# Patient Record
Sex: Female | Born: 1975
Health system: Southern US, Community
[De-identification: ages and names within clinical notes are randomized; demographics above are authoritative.]

## PROBLEM LIST (undated history)

## (undated) DIAGNOSIS — J45909 Unspecified asthma, uncomplicated: Secondary | ICD-10-CM

## (undated) DIAGNOSIS — N946 Dysmenorrhea, unspecified: Secondary | ICD-10-CM

## (undated) DIAGNOSIS — K649 Unspecified hemorrhoids: Secondary | ICD-10-CM

## (undated) DIAGNOSIS — N83202 Unspecified ovarian cyst, left side: Secondary | ICD-10-CM

## (undated) DIAGNOSIS — I499 Cardiac arrhythmia, unspecified: Secondary | ICD-10-CM

## (undated) DIAGNOSIS — I10 Essential (primary) hypertension: Secondary | ICD-10-CM

## (undated) DIAGNOSIS — Z309 Encounter for contraceptive management, unspecified: Secondary | ICD-10-CM

## (undated) DIAGNOSIS — K602 Anal fissure, unspecified: Secondary | ICD-10-CM

## (undated) DIAGNOSIS — N83201 Unspecified ovarian cyst, right side: Secondary | ICD-10-CM

## (undated) HISTORY — DX: Dysmenorrhea, unspecified: N94.6

## (undated) HISTORY — DX: Unspecified ovarian cyst, right side: N83.201

## (undated) HISTORY — DX: Unspecified ovarian cyst, left side: N83.202

## (undated) HISTORY — DX: Anal fissure, unspecified: K60.2

## (undated) HISTORY — PX: WISDOM TOOTH EXTRACTION: SHX21

## (undated) HISTORY — DX: Encounter for contraceptive management, unspecified: Z30.9

## (undated) HISTORY — DX: Unspecified hemorrhoids: K64.9

## (undated) HISTORY — DX: Unspecified asthma, uncomplicated: J45.909

---

## 2001-04-06 ENCOUNTER — Other Ambulatory Visit: Admission: RE | Admit: 2001-04-06 | Discharge: 2001-04-06 | Payer: Self-pay | Admitting: *Deleted

## 2006-06-14 ENCOUNTER — Emergency Department (HOSPITAL_COMMUNITY): Admission: EM | Admit: 2006-06-14 | Discharge: 2006-06-14 | Payer: Self-pay | Admitting: Emergency Medicine

## 2008-01-19 ENCOUNTER — Other Ambulatory Visit: Admission: RE | Admit: 2008-01-19 | Discharge: 2008-01-19 | Payer: Self-pay | Admitting: Obstetrics and Gynecology

## 2008-12-28 ENCOUNTER — Other Ambulatory Visit: Admission: RE | Admit: 2008-12-28 | Discharge: 2008-12-28 | Payer: Self-pay | Admitting: Obstetrics & Gynecology

## 2009-02-06 ENCOUNTER — Ambulatory Visit (HOSPITAL_COMMUNITY): Admission: RE | Admit: 2009-02-06 | Discharge: 2009-02-06 | Payer: Self-pay | Admitting: Obstetrics & Gynecology

## 2009-02-20 ENCOUNTER — Ambulatory Visit (HOSPITAL_COMMUNITY): Admission: RE | Admit: 2009-02-20 | Discharge: 2009-02-20 | Payer: Self-pay | Admitting: Obstetrics & Gynecology

## 2009-04-03 ENCOUNTER — Ambulatory Visit (HOSPITAL_COMMUNITY): Admission: RE | Admit: 2009-04-03 | Discharge: 2009-04-03 | Payer: Self-pay | Admitting: Obstetrics & Gynecology

## 2009-05-02 ENCOUNTER — Ambulatory Visit (HOSPITAL_COMMUNITY): Admission: RE | Admit: 2009-05-02 | Discharge: 2009-05-02 | Payer: Self-pay | Admitting: Obstetrics & Gynecology

## 2009-05-31 ENCOUNTER — Ambulatory Visit (HOSPITAL_COMMUNITY): Admission: RE | Admit: 2009-05-31 | Discharge: 2009-05-31 | Payer: Self-pay | Admitting: Obstetrics & Gynecology

## 2009-06-18 ENCOUNTER — Ambulatory Visit: Payer: Self-pay | Admitting: Obstetrics and Gynecology

## 2009-06-18 ENCOUNTER — Inpatient Hospital Stay (HOSPITAL_COMMUNITY): Admission: AD | Admit: 2009-06-18 | Discharge: 2009-06-18 | Payer: Self-pay | Admitting: Obstetrics & Gynecology

## 2009-06-23 ENCOUNTER — Ambulatory Visit: Payer: Self-pay | Admitting: Physician Assistant

## 2009-06-23 ENCOUNTER — Inpatient Hospital Stay (HOSPITAL_COMMUNITY): Admission: AD | Admit: 2009-06-23 | Discharge: 2009-06-23 | Payer: Self-pay | Admitting: Obstetrics & Gynecology

## 2009-06-26 ENCOUNTER — Inpatient Hospital Stay (HOSPITAL_COMMUNITY): Admission: AD | Admit: 2009-06-26 | Discharge: 2009-06-29 | Payer: Self-pay | Admitting: Obstetrics & Gynecology

## 2009-06-26 ENCOUNTER — Ambulatory Visit: Payer: Self-pay | Admitting: Obstetrics and Gynecology

## 2009-06-27 ENCOUNTER — Encounter: Payer: Self-pay | Admitting: Obstetrics & Gynecology

## 2010-09-05 IMAGING — US US OB FOLLOW-UP
1 series · 14 of 28 positions shown · non-contrast
Comparison: none

OBSTETRICAL ULTRASOUND:
 This ultrasound exam was performed in the [HOSPITAL] Ultrasound Department.  The OB US report was generated in the AS system, and faxed to the ordering physician.  This report is also available in [REDACTED] PACS.

[Series 1: us ob follow up · 0.33mm/px · 14 of 37 slices shown]
[im 2/37]
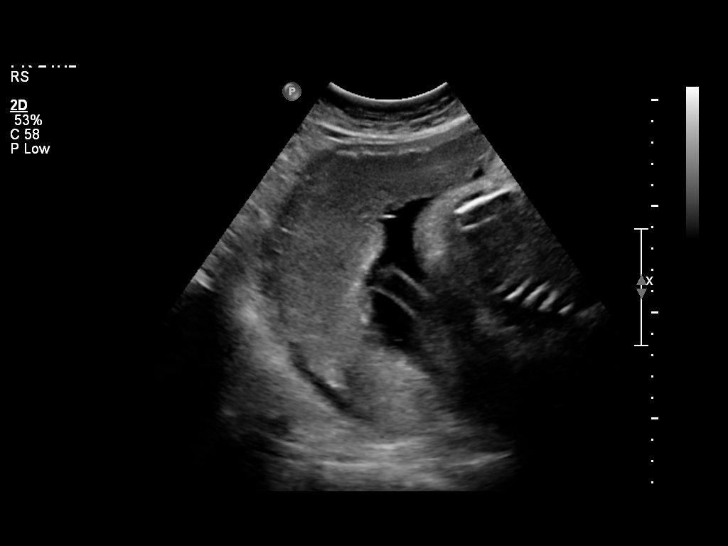
[im 5/37]
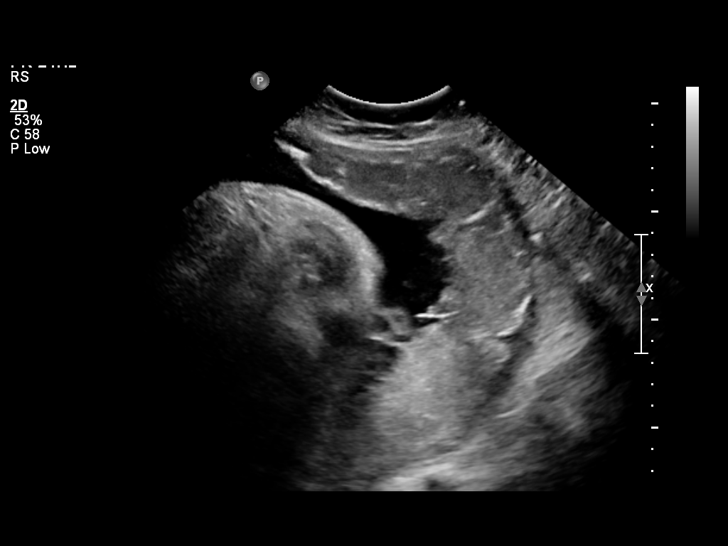
[im 7/37]
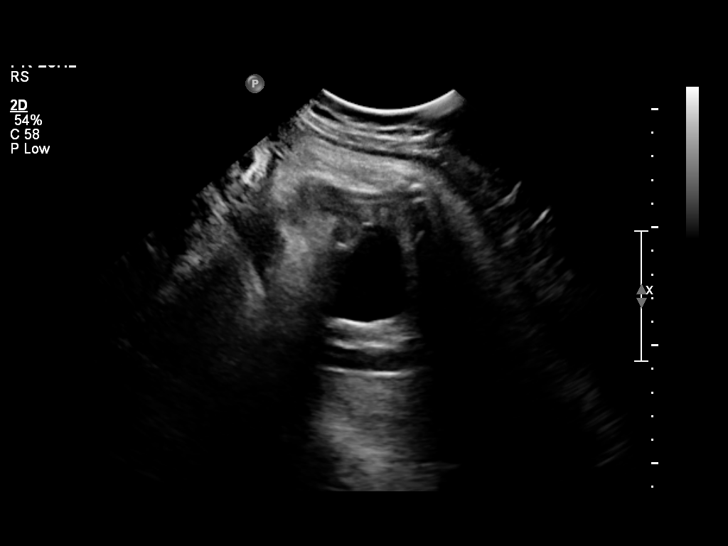
[im 10/37]
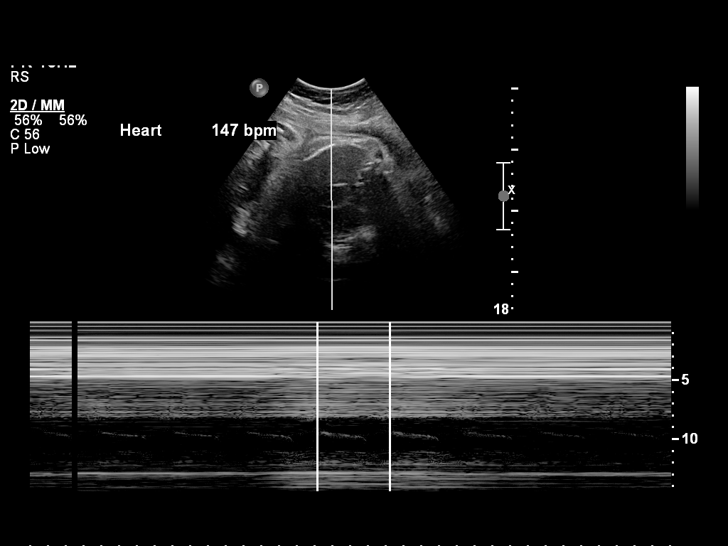
[im 13/37]
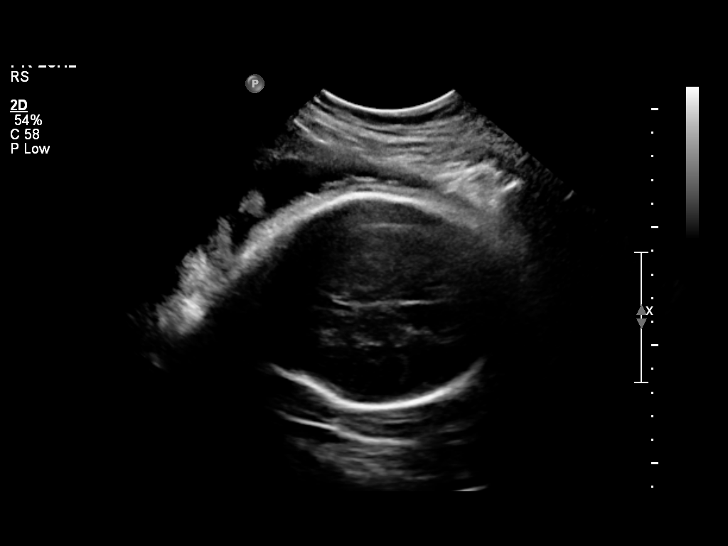
[im 15/37]
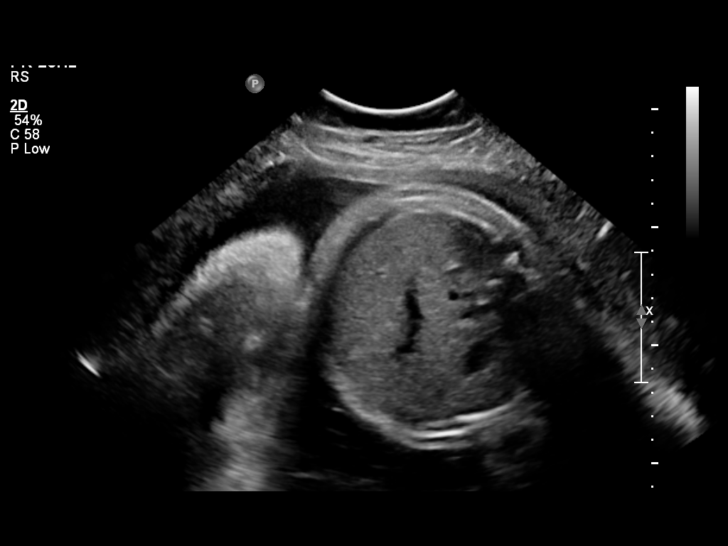
[im 18/37]
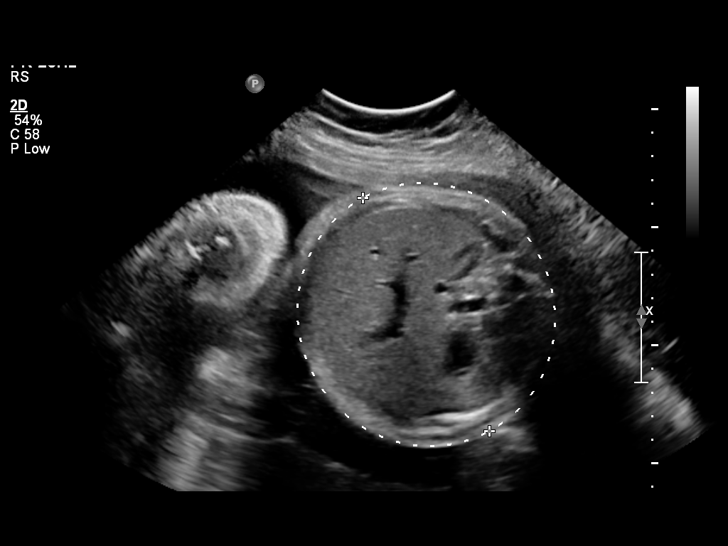
[im 21/37]
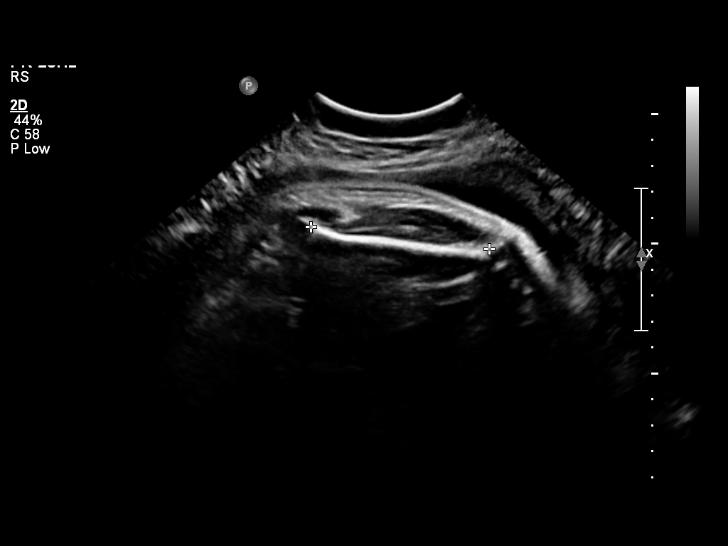
[im 23/37]
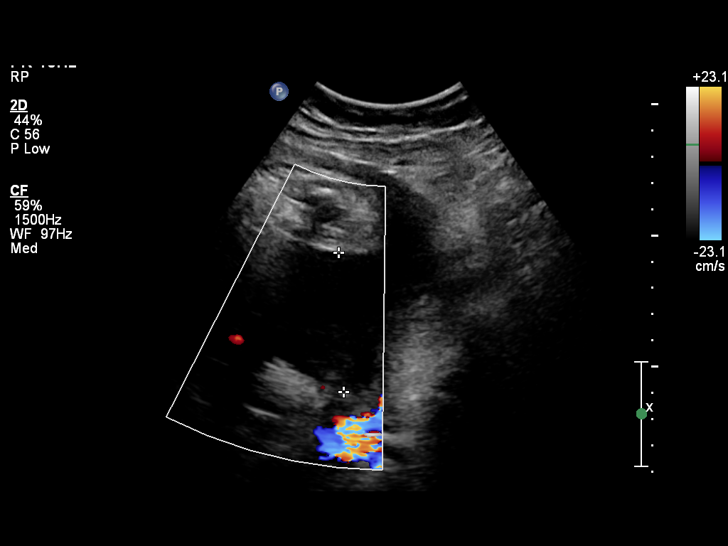
[im 26/37]
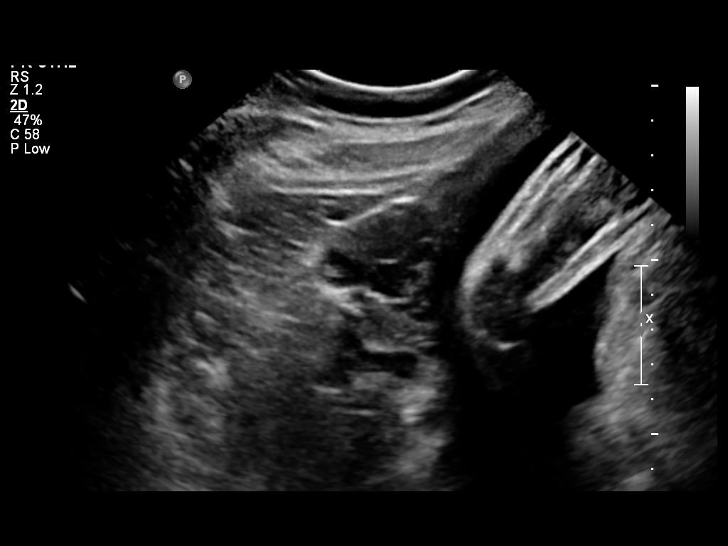
[im 29/37]
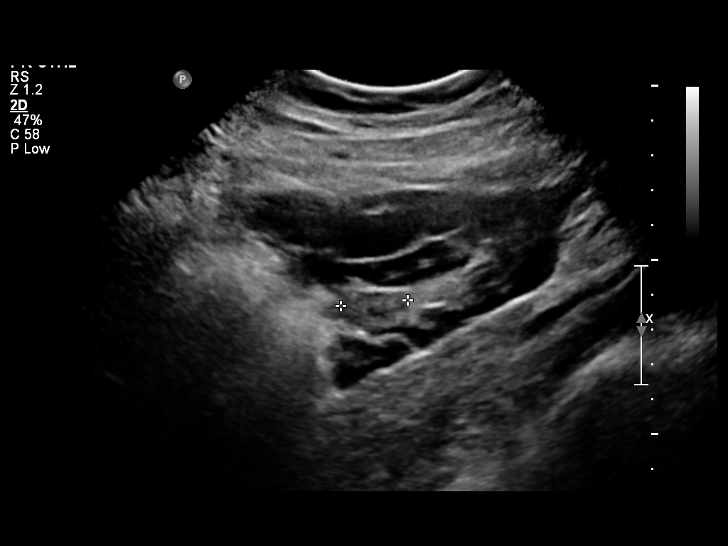
[im 31/37]
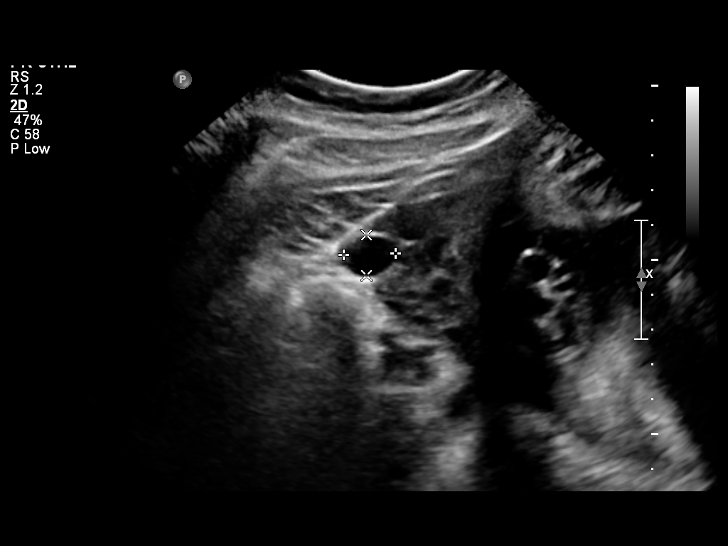
[im 34/37]
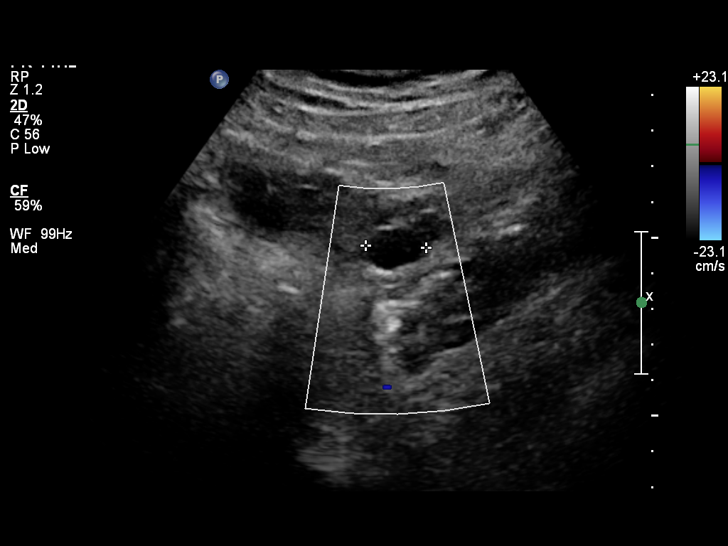
[im 37/37]
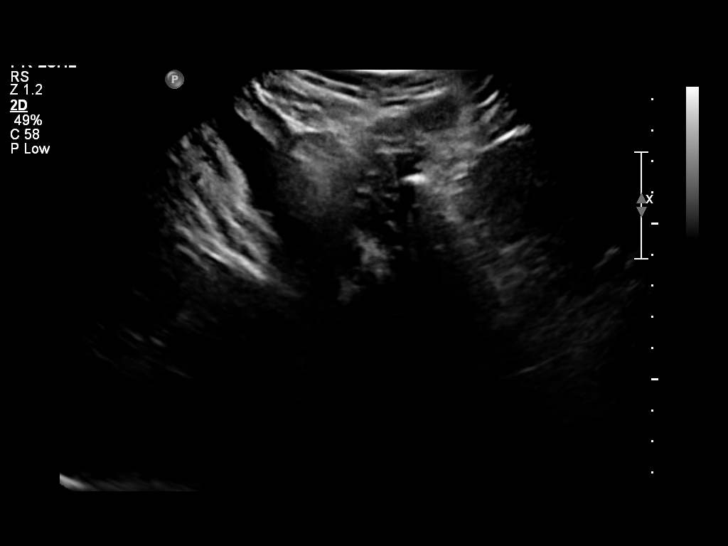

[14 of 28 positions shown; findings below may reference images not displayed]

IMPRESSION: See AS Obstetric US report.

## 2010-10-07 ENCOUNTER — Other Ambulatory Visit: Admission: RE | Admit: 2010-10-07 | Discharge: 2010-10-07 | Payer: Self-pay | Admitting: Obstetrics and Gynecology

## 2010-12-26 ENCOUNTER — Ambulatory Visit (HOSPITAL_COMMUNITY)
Admission: RE | Admit: 2010-12-26 | Discharge: 2010-12-26 | Payer: Self-pay | Source: Home / Self Care | Attending: Family Medicine | Admitting: Family Medicine

## 2011-01-08 ENCOUNTER — Encounter (HOSPITAL_COMMUNITY)
Admission: RE | Admit: 2011-01-08 | Discharge: 2011-01-13 | Payer: Self-pay | Source: Home / Self Care | Attending: Family Medicine | Admitting: Family Medicine

## 2011-02-16 ENCOUNTER — Ambulatory Visit (INDEPENDENT_AMBULATORY_CARE_PROVIDER_SITE_OTHER): Payer: BC Managed Care – PPO | Admitting: Internal Medicine

## 2011-03-22 LAB — URINE MICROSCOPIC-ADD ON

## 2011-03-22 LAB — CBC
HCT: 32.4 % — ABNORMAL LOW (ref 36.0–46.0)
HCT: 32.7 % — ABNORMAL LOW (ref 36.0–46.0)
Hemoglobin: 11.5 g/dL — ABNORMAL LOW (ref 12.0–15.0)
Hemoglobin: 11.7 g/dL — ABNORMAL LOW (ref 12.0–15.0)
MCHC: 35.3 g/dL (ref 30.0–36.0)
MCV: 98.8 fL (ref 78.0–100.0)
Platelets: 258 10*3/uL (ref 150–400)
Platelets: 207 10*3/uL (ref 150–400)
Platelets: 233 10*3/uL (ref 150–400)
Platelets: 276 10*3/uL (ref 150–400)
RBC: 3.25 MIL/uL — ABNORMAL LOW (ref 3.87–5.11)
RDW: 14.6 % (ref 11.5–15.5)
WBC: 21 10*3/uL — ABNORMAL HIGH (ref 4.0–10.5)
WBC: 11.4 10*3/uL — ABNORMAL HIGH (ref 4.0–10.5)
WBC: 15.3 10*3/uL — ABNORMAL HIGH (ref 4.0–10.5)

## 2011-03-22 LAB — URINALYSIS, ROUTINE W REFLEX MICROSCOPIC
Bilirubin Urine: NEGATIVE
Glucose, UA: NEGATIVE mg/dL
Ketones, ur: NEGATIVE mg/dL
Nitrite: NEGATIVE
Protein, ur: NEGATIVE mg/dL
Specific Gravity, Urine: 1.02 (ref 1.005–1.030)
pH: 7.5 (ref 5.0–8.0)

## 2011-03-22 LAB — COMPREHENSIVE METABOLIC PANEL
ALT: 20 U/L (ref 0–35)
Albumin: 2.3 g/dL — ABNORMAL LOW (ref 3.5–5.2)
Albumin: 2.4 g/dL — ABNORMAL LOW (ref 3.5–5.2)
Albumin: 2.4 g/dL — ABNORMAL LOW (ref 3.5–5.2)
Alkaline Phosphatase: 108 U/L (ref 39–117)
Alkaline Phosphatase: 114 U/L (ref 39–117)
Alkaline Phosphatase: 125 U/L — ABNORMAL HIGH (ref 39–117)
BUN: 3 mg/dL — ABNORMAL LOW (ref 6–23)
BUN: <1 mg/dL — ABNORMAL LOW (ref 6–23)
Calcium: 9.7 mg/dL (ref 8.4–10.5)
Chloride: 102 mEq/L (ref 96–112)
Chloride: 105 mEq/L (ref 96–112)
Creatinine, Ser: 0.69 mg/dL (ref 0.4–1.2)
GFR calc non Af Amer: >60 mL/min (ref >60)
Glucose, Bld: 101 mg/dL — ABNORMAL HIGH (ref 70–99)
Glucose, Bld: 96 mg/dL (ref 70–99)
Potassium: 3.8 mEq/L (ref 3.5–5.1)
Potassium: 3.9 mEq/L (ref 3.5–5.1)
Potassium: 4.3 mEq/L (ref 3.5–5.1)
Sodium: 138 mEq/L (ref 135–145)
Total Bilirubin: 0.6 mg/dL (ref 0.3–1.2)
Total Bilirubin: 0.6 mg/dL (ref 0.3–1.2)
Total Protein: 5.5 g/dL — ABNORMAL LOW (ref 6.0–8.3)

## 2011-03-22 LAB — DIFFERENTIAL
Basophils Absolute: 0 10*3/uL (ref 0.0–0.1)
Basophils Relative: 0 % (ref 0–1)
Eosinophils Absolute: 0.1 10*3/uL (ref 0.0–0.7)
Neutro Abs: 12.1 10*3/uL — ABNORMAL HIGH (ref 1.7–7.7)
Neutrophils Relative %: 84 % — ABNORMAL HIGH (ref 43–77)

## 2011-03-22 LAB — URIC ACID: Uric Acid, Serum: 6.3 mg/dL (ref 2.4–7.0)

## 2011-03-22 LAB — RPR: RPR Ser Ql: NONREACTIVE

## 2011-03-22 LAB — LACTATE DEHYDROGENASE: LDH: 158 U/L (ref 94–250)

## 2011-04-28 NOTE — Discharge Summary (Signed)
Stephanie Paul, Stephanie Paul             ACCOUNT NO.:  [id number]   MEDICAL RECORD NO.:  [id number]          PATIENT TYPE:  INP   LOCATION:  9304                          FACILITY:  [facility]   PHYSICIAN:  Tilda Burrow, M.D. DATE OF BIRTH:  1976/07/21   DATE OF ADMISSION:  06/26/2009  DATE OF DISCHARGE:  06/29/2009                               DISCHARGE SUMMARY   ADMITTING DIAGNOSES:  1. Intrauterine pregnancy at 37 weeks.  2. Mild preeclampsia.  3. Unfavorable cervix.   DISCHARGE DIAGNOSES:  1. Intrauterine pregnancy at 37 weeks.  2. Mild preeclampsia.  3. Unfavorable cervix.  4. Resolved chorioamnionitis.   HOSPITAL COURSE:  Stephanie Paul is a 35 year old white female, gravida  2, para 0-0-1-0 who was admitted at 37-2/7th weeks gestation for  induction of labor due to mild preeclampsia.  Her pregnancy has been  followed by the [facility] and has been essentially  unremarkable other than the patient having mild blood pressure  elevations during the last couple of weeks.  She also had a recent 24-  hour urine that had 630 mg of protein.  The patient was admitted to  labor and delivery and was started with Cytotec for ripening.  Her  diastolic pressures were in the 80s, fetal heart rate was reassuring.  She then had a Foley bulb placed to continue the cervical ripening.  By  the morning of June 27, 2009, she had progressed to 5 cm and was in  early active labor.  She was continuing on the magnesium.  By the  afternoon of June 27, 2009, her temperature was 102.2.  Her cervix was  continuing to dilate and she progressed to vaginal delivery at 5:12 p.m.  on June 27, 2009.  Infant was a viable female, weight was 7 pounds and 5  ounces.  Apgars were 4 and 7.  Infant was taken to the NICU.  The  patient had a repair of a periurethral laceration that was repaired.  The patient was stable and was taken to AICU to continue the magnesium  therapy.  By postpartum day #1, the patient was  doing well.  She had  been afebrile since delivery.  Blood pressures were 90-118 over 60-90.  CBC showed hemoglobin of 9.7, hematocrit 26.4, white blood cell count  21.0, and platelets 258,000.  By the evening of the 16th when she was 24  hours post delivery, magnesium was discontinued and the patient was  transferred to the floor.  By postpartum day #2, she was feeling much  better and was ready to go home.  Vital signs were stable.  Blood  pressures is 103/67.  She continued to remain afebrile.  The infant was  still in the NICU and most likely would be there through the weekend.  The patient was interested in birth control pills for contraception.  She was deemed to have received a full benefit of her hospital stay and  she was to be discharged home later this evening per her request.  Motrin 600 mg one p.o. q.6 h p.r.n. pain, Micronor one p.o. daily to  start on  July 14, 2009, these are her discharge medications, as well as iron one  p.o. daily and prenatal vitamin one p.o. daily.  Discharge follow up  will occur at [facility] in 6 weeks or as needed.   DISCHARGE INSTRUCTIONS:  Per postpartum handout.      Stephanie Paul, C.N.M.      Tilda Burrow, M.D.  Electronically Signed    KS/MEDQ  D:  06/29/2009  T:  06/29/2009  Job:  [id number]

## 2011-04-28 NOTE — H&P (Signed)
NAME:  Stephanie Paul, Stephanie Paul NO.:  [id number]   MEDICAL RECORD NO.:  [id number]          PATIENT TYPE:  INP   LOCATION:  9165                          FACILITY:  [facility]   PHYSICIAN:  Lazaro Arms, M.D.   DATE OF BIRTH:  September 05, 1976   DATE OF ADMISSION:  06/26/2009  DATE OF DISCHARGE:                              HISTORY & PHYSICAL   HISTORY OF PRESENT ILLNESS:  The patient is a 35 year old white female,  gravida 2, para 0, abortus 1, estimated date of delivery of 07/16/2009  by 6 weeks sonogram, currently 47 and 2/7 weeks' gestation, who is  admitted for induction of labor for mild preeclampsia.  The patient has  had borderline blood pressures now for the last couple of weeks.  Her  first trimester blood pressures were also a little borderline, but she  basically was in the 120/70-80 range, now she is in the 140/90 range.  We had done two 24-hour urine, the first one was borderline at around  300 and this one has come back as 630 mg in 24 hours.  The patient came  to the office due to lot of back pain and was crying and have a blood  pressure of 117/100 which I do not think was actually accurate because  of her pain.  She also had 2+ protein today in her urine which is  dramatically different.  Her other labs have been normal including uric  acid.  Since resolved, she is admitted for cervical ripening and  induction of labor for mild preeclampsia at 37 weeks.  She is group B  strep positive from a urine culture.   REVIEW OF SYSTEMS:  Negative.   PAST SURGICAL HISTORY:  Negative.   ALLERGIES:  PENICILLIN.   SOCIAL HISTORY:  She is married and works at [employer].   PAST OB HISTORY:  She had elective termination in 2002.   PHYSICAL EXAMINATION:  HEENT:  Unremarkable.  Thyroid is normal.  LUNGS:  Clear.  HEART:  Regular rate and rhythm without murmur, rubs, or gallop.  BREASTS:  Without masses or skin changes.  ABDOMEN:  Fundal height of 37 cm.  Cervix is long, thick,  closed, firm,  posterior, -2 vertex, Bishop of nothing.  EXTREMITIES:  Warm with 1+ edema.  DTRs are 2+ with no clonus.   LABORATORY DATA:  Blood type is O positive.  Rubella is immune.  Varicella is immune.  Antibody screen is negative.  Baseline platelet  count 258,000.  Hepatitis B is negative x2.  HIV negative x2.  HSV is  negative.  Serology is negative x2.  Pap was normal.  GC and Chlamydia  is negative x2.  She had a slightly elevated Down's risk of 1:200.  Her  Glucola was normal to 111.   IMPRESSION:  1. Intrauterine pregnancy at [redacted] weeks gestation.  2. Mild preeclampsia.  3. Unfavorable cervix.   PLAN:  The patient is admitted for cervical ripening and induction of  labor.  She understands the indications and will proceed.      Lazaro Arms, M.D.  Electronically Signed  LHE/MEDQ  D:  06/26/2009  T:  06/27/2009  Job:  [id number]

## 2011-05-05 ENCOUNTER — Other Ambulatory Visit (HOSPITAL_COMMUNITY): Payer: Self-pay | Admitting: Family Medicine

## 2011-05-05 DIAGNOSIS — R109 Unspecified abdominal pain: Secondary | ICD-10-CM

## 2011-05-05 DIAGNOSIS — R1110 Vomiting, unspecified: Secondary | ICD-10-CM

## 2011-05-07 ENCOUNTER — Ambulatory Visit (HOSPITAL_COMMUNITY)
Admission: RE | Admit: 2011-05-07 | Discharge: 2011-05-07 | Disposition: A | Discharge status: Home / Self Care | Payer: BC Managed Care – PPO | Source: Ambulatory Visit | Attending: Family Medicine | Admitting: Family Medicine

## 2011-05-07 DIAGNOSIS — R1011 Right upper quadrant pain: Secondary | ICD-10-CM | POA: Insufficient documentation

## 2011-05-07 DIAGNOSIS — R1110 Vomiting, unspecified: Secondary | ICD-10-CM | POA: Insufficient documentation

## 2011-05-07 DIAGNOSIS — R109 Unspecified abdominal pain: Secondary | ICD-10-CM

## 2012-03-28 ENCOUNTER — Other Ambulatory Visit: Payer: Self-pay | Admitting: Obstetrics & Gynecology

## 2012-03-28 ENCOUNTER — Other Ambulatory Visit (HOSPITAL_COMMUNITY)
Admission: RE | Admit: 2012-03-28 | Discharge: 2012-03-28 | Disposition: A | Discharge status: Home / Self Care | Payer: BC Managed Care – PPO | Source: Ambulatory Visit | Attending: Obstetrics & Gynecology | Admitting: Obstetrics & Gynecology

## 2012-03-28 DIAGNOSIS — Z01419 Encounter for gynecological examination (general) (routine) without abnormal findings: Secondary | ICD-10-CM | POA: Insufficient documentation

## 2013-03-27 ENCOUNTER — Other Ambulatory Visit (HOSPITAL_COMMUNITY): Payer: Self-pay | Admitting: Family Medicine

## 2013-03-27 DIAGNOSIS — R110 Nausea: Secondary | ICD-10-CM

## 2013-03-27 DIAGNOSIS — R109 Unspecified abdominal pain: Secondary | ICD-10-CM

## 2013-03-28 ENCOUNTER — Encounter: Payer: Self-pay | Admitting: *Deleted

## 2013-03-28 ENCOUNTER — Ambulatory Visit (HOSPITAL_COMMUNITY)
Admission: RE | Admit: 2013-03-28 | Discharge: 2013-03-28 | Disposition: A | Discharge status: Home / Self Care | Payer: BC Managed Care – PPO | Source: Ambulatory Visit | Attending: Family Medicine | Admitting: Family Medicine

## 2013-03-28 DIAGNOSIS — R109 Unspecified abdominal pain: Secondary | ICD-10-CM | POA: Insufficient documentation

## 2013-03-28 DIAGNOSIS — R110 Nausea: Secondary | ICD-10-CM | POA: Insufficient documentation

## 2013-03-28 DIAGNOSIS — N83209 Unspecified ovarian cyst, unspecified side: Secondary | ICD-10-CM | POA: Insufficient documentation

## 2013-03-29 ENCOUNTER — Encounter: Payer: Self-pay | Admitting: Adult Health

## 2013-03-29 ENCOUNTER — Other Ambulatory Visit (HOSPITAL_COMMUNITY)
Admission: RE | Admit: 2013-03-29 | Discharge: 2013-03-29 | Disposition: A | Discharge status: Home / Self Care | Payer: BC Managed Care – PPO | Source: Ambulatory Visit | Attending: Adult Health | Admitting: Adult Health

## 2013-03-29 ENCOUNTER — Ambulatory Visit (INDEPENDENT_AMBULATORY_CARE_PROVIDER_SITE_OTHER): Payer: BC Managed Care – PPO | Admitting: Adult Health

## 2013-03-29 VITALS — BP 120/78 | HR 72 | Ht 64.0 in | Wt 151.0 lb

## 2013-03-29 DIAGNOSIS — Z0000 Encounter for general adult medical examination without abnormal findings: Secondary | ICD-10-CM

## 2013-03-29 DIAGNOSIS — Z01419 Encounter for gynecological examination (general) (routine) without abnormal findings: Secondary | ICD-10-CM

## 2013-03-29 DIAGNOSIS — Z1151 Encounter for screening for human papillomavirus (HPV): Secondary | ICD-10-CM | POA: Insufficient documentation

## 2013-03-29 LAB — COMPREHENSIVE METABOLIC PANEL
ALT: 16 U/L (ref 0–35)
AST: 18 U/L (ref 0–37)
Albumin: 4.4 g/dL (ref 3.5–5.2)
Alkaline Phosphatase: 67 U/L (ref 39–117)
Potassium: 4.9 mEq/L (ref 3.5–5.3)
Sodium: 138 mEq/L (ref 135–145)
Total Bilirubin: 1.1 mg/dL (ref 0.3–1.2)
Total Protein: 7.3 g/dL (ref 6.0–8.3)

## 2013-03-29 LAB — CBC
Hemoglobin: 14.1 g/dL (ref 12.0–15.0)
MCH: 33.3 pg (ref 26.0–34.0)
MCHC: 34.3 g/dL (ref 30.0–36.0)
Platelets: 221 10*3/uL (ref 150–400)

## 2013-03-29 LAB — LIPID PANEL
Cholesterol: 150 mg/dL (ref 0–200)
LDL Cholesterol: 88 mg/dL (ref 0–99)
Total CHOL/HDL Ratio: 3.7 Ratio
VLDL: 21 mg/dL (ref 0–40)

## 2013-03-29 NOTE — Progress Notes (Signed)
Patient ID: Stephanie Paul, female   DOB: [birth year], 37 y.o.   MRN: [id number] History of Present Illness:Stephanie Paul is a 37 year old white female, married in today for her pap and physical. She had her IUD removed in January and is not using any birth control at present and says it is ok if she gets pregnant, she is taking a multi-vitamin.She says she had gallbladder ultrasound yesterday.She has been having nausea and bloating.    Current Medications, Allergies, Past Medical History, Past Surgical History, Family History and Social History were reviewed in [employer] record.     Review of Systems:Patient denies any headaches, blurred vision, shortness of breath, chest pain, abdominal pain, problems with bowel movements, urination, or intercourse. No muscle aches or mood changes.See HPI for positives.  Physical Exam: Blood pressure 120/78, pulse 72, height 5\' 4"  (1.626 m), weight 151 lb (68.493 kg), last menstrual period 03/14/2013. General:  Well developed, well nourished, no acute distress Skin:  Warm and dry Neck:  Midline trachea, normal thyroid Lungs; Clear to auscultation bilaterally Breast:  No dominant palpable mass, retraction, or nipple discharge Cardiovascular: Regular rate and rhythm Abdomen:  Soft, non tender, no hepatosplenomegaly Pelvic:  External genitalia is normal in appearance.  The vagina is normal in appearance.  The cervix is bulbous. Pap performed with HPV.  Uterus is felt to be normal size, shape, and contour.  No adnexal masses or tenderness noted. Extremities:  No swelling or varicosities noted Psych:  No mood changes, alert and cooperative.   Impression: Yearly exam Being worked up for gallbladder issues  Plan:Check CBC,CMP,TSH, and lipid profile Make sure Multi vitamin has 800 mcg of folic acid Mammogram at 39 Follow up in 1 year for physical, 3 years for pap if negative HPV

## 2013-03-29 NOTE — Patient Instructions (Addendum)
Take folic acid Mammogram at 39  Follow up 1 year physical Sign for my chart

## 2013-03-30 ENCOUNTER — Telehealth: Payer: Self-pay | Admitting: Adult Health

## 2013-03-30 NOTE — Telephone Encounter (Signed)
Left message that labs were normal and I will mail her a copy

## 2013-04-05 ENCOUNTER — Ambulatory Visit (INDEPENDENT_AMBULATORY_CARE_PROVIDER_SITE_OTHER): Payer: BC Managed Care – PPO | Admitting: Internal Medicine

## 2013-04-05 ENCOUNTER — Encounter (INDEPENDENT_AMBULATORY_CARE_PROVIDER_SITE_OTHER): Payer: Self-pay | Admitting: Internal Medicine

## 2013-04-05 VITALS — BP 108/64 | HR 72 | Ht 63.0 in | Wt 155.5 lb

## 2013-04-05 DIAGNOSIS — K219 Gastro-esophageal reflux disease without esophagitis: Secondary | ICD-10-CM

## 2013-04-05 MED ORDER — OMEPRAZOLE 40 MG PO CPDR: 40.0000 mg | DELAYED_RELEASE_CAPSULE | Freq: Every day | ORAL | Status: DC

## 2013-04-05 NOTE — Patient Instructions (Addendum)
OV in 3 months. Rx Omeprazole

## 2013-04-05 NOTE — Progress Notes (Signed)
Subjective:     Patient ID: Stephanie Paul, female   DOB: [birth year], 37 y.o.   MRN: [id number]  HPI Referred to our office by Dr. Regino Schultze for nausea after eating x 1 1/2- 2 weeks. She tells me she has her appetite back.  She has been eating. She tells me she lost 6 pounds but now has gained this back.  She has acid reflux after she eats. She has been taking Tums. There is no pain.  No abdominal pain.  BMs are normal. Hx of elevated bilirubin. Seen in our office and was normal with recheck.    03/27/2013 US abdomen: normal.  05/07/2011 US abdomen for ruq pain and vomiting. Normal exam.  01/08/2011 HIDA scan : Normal. 03/27/2013 total bili 1.3, ALP 65, ALT 18, AST19 H and H 40.5 and 94.6, MCV 94.6  Review of Systems Current Outpatient Prescriptions  Medication Sig Dispense Refill  . Multiple Vitamin (MULTIVITAMIN) tablet Take 1 tablet by mouth daily.      Marland Kitchen omeprazole (PRILOSEC) 40 MG capsule Take 1 capsule (40 mg total) by mouth daily.  90 capsule  3   No current facility-administered medications for this visit.   Past Medical History  Diagnosis Date  . Ovarian cyst, right    History reviewed. No pertinent past surgical history. Allergies  Allergen Reactions  . Penicillins        Objective:   Physical Exam Filed Vitals:   04/05/13 1540  BP: 108/64  Pulse: 72  Height: 5\' 3"  (1.6 m)  Weight: 155 lb 8 oz (70.534 kg)   Alert and oriented. Skin warm and dry. Oral mucosa is moist.   . Sclera anicteric, conjunctivae is pink. Thyroid not enlarged. No cervical lymphadenopathy. Lungs clear. Heart regular rate and rhythm.  Abdomen is soft. Bowel sounds are positive. No hepatomegaly. No abdominal masses felt. No tenderness.  No edema to lower extremities. Patient is alert and oriented.    Assessment:    Nausea which has resolved. GERD.    Plan:     Rx for Omeprazole eprescribed to her pharmacy. If nausea reoccurs call our office.

## 2013-04-20 ENCOUNTER — Encounter (INDEPENDENT_AMBULATORY_CARE_PROVIDER_SITE_OTHER): Payer: Self-pay

## 2013-05-17 ENCOUNTER — Encounter (INDEPENDENT_AMBULATORY_CARE_PROVIDER_SITE_OTHER): Payer: Self-pay | Admitting: *Deleted

## 2013-07-05 ENCOUNTER — Ambulatory Visit (INDEPENDENT_AMBULATORY_CARE_PROVIDER_SITE_OTHER): Payer: BC Managed Care – PPO | Admitting: Internal Medicine

## 2013-07-17 ENCOUNTER — Other Ambulatory Visit (HOSPITAL_COMMUNITY): Payer: Self-pay | Admitting: Family Medicine

## 2013-07-17 ENCOUNTER — Ambulatory Visit (HOSPITAL_COMMUNITY)
Admission: RE | Admit: 2013-07-17 | Discharge: 2013-07-17 | Disposition: A | Discharge status: Home / Self Care | Payer: BC Managed Care – PPO | Source: Ambulatory Visit | Attending: Family Medicine | Admitting: Family Medicine

## 2013-07-17 DIAGNOSIS — Z87891 Personal history of nicotine dependence: Secondary | ICD-10-CM | POA: Insufficient documentation

## 2013-07-17 DIAGNOSIS — R05 Cough: Secondary | ICD-10-CM | POA: Insufficient documentation

## 2013-07-17 DIAGNOSIS — R059 Cough, unspecified: Secondary | ICD-10-CM | POA: Insufficient documentation

## 2013-07-17 DIAGNOSIS — J069 Acute upper respiratory infection, unspecified: Secondary | ICD-10-CM

## 2014-05-25 ENCOUNTER — Telehealth: Payer: Self-pay | Admitting: Adult Health

## 2014-05-25 MED ORDER — FLUCONAZOLE 150 MG PO TABS: ORAL_TABLET | ORAL | Status: DC

## 2014-05-25 NOTE — Telephone Encounter (Signed)
Pt c/o vaginal itching, discharge, and irritation. Requesting medication for yeast. Pt states went swimming for 2 days straight and now having these symptoms. Pt does have an annual exam with Derrek Monaco, NP on 06/19/2014.

## 2014-05-25 NOTE — Telephone Encounter (Signed)
Complains of yeast will rx diflucan

## 2014-06-10 IMAGING — US US ABDOMEN COMPLETE
1 series · 14 of 25 positions shown · non-contrast
Comparison: Previous examinations, the most recent dated
05/07/2011.

CLINICAL DATA: Abdominal pain and nausea.

COMPLETE ABDOMINAL ULTRASOUND

[Series 1: us abdomen complete · 0.20mm/px · 14 of 117 slices shown]
[im 1/117]
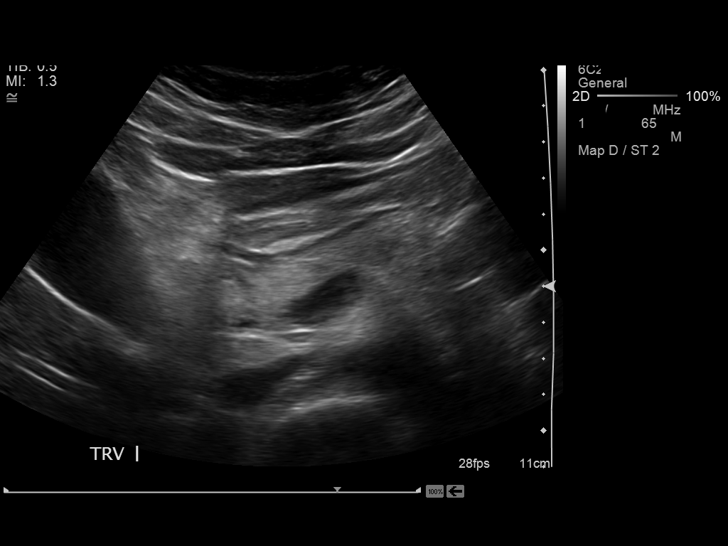
[im 10/117]
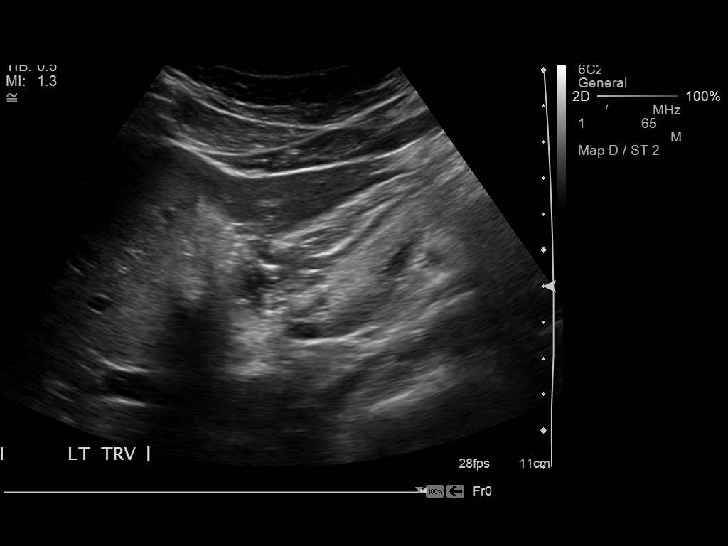
[im 20/117]
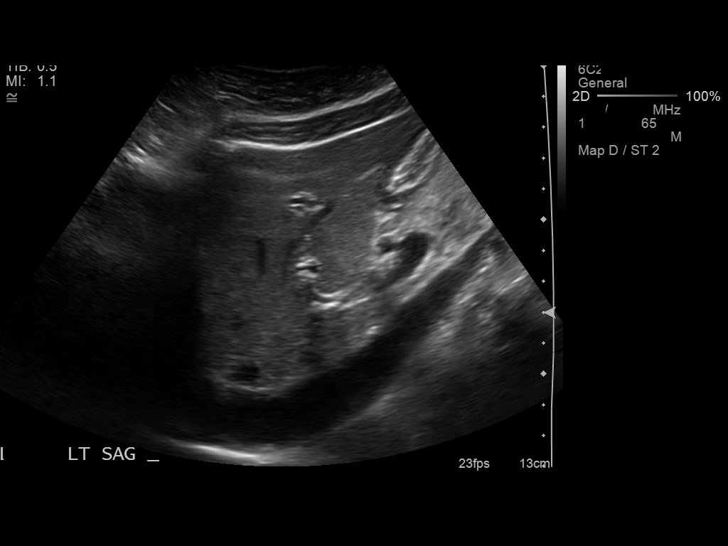
[im 30/117]
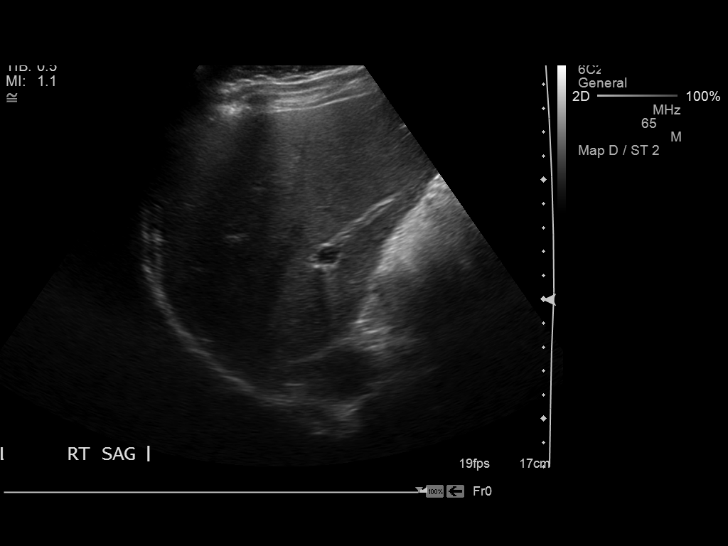
[im 39/117]
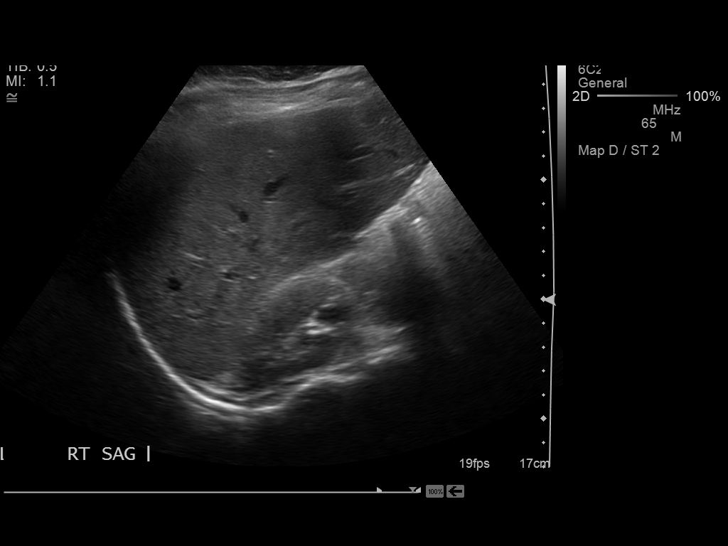
[im 44/117]
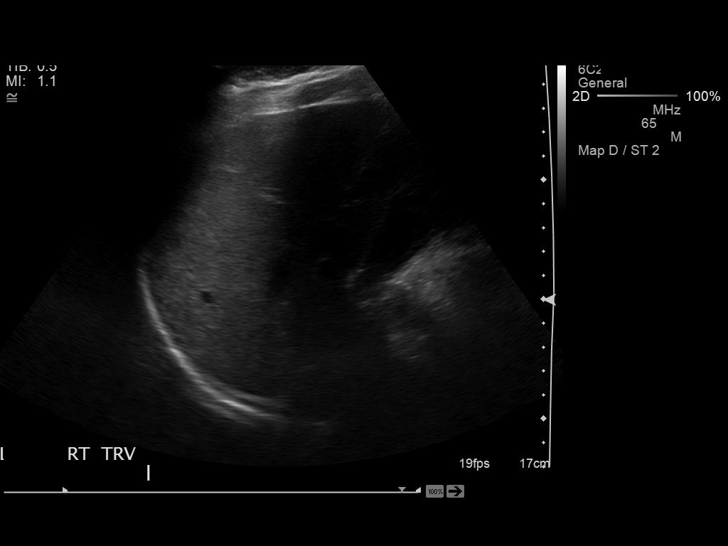
[im 54/117]
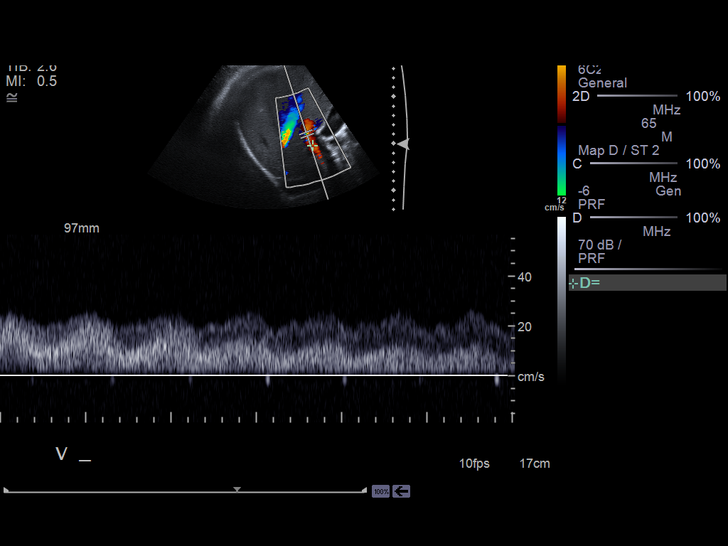
[im 63/117]
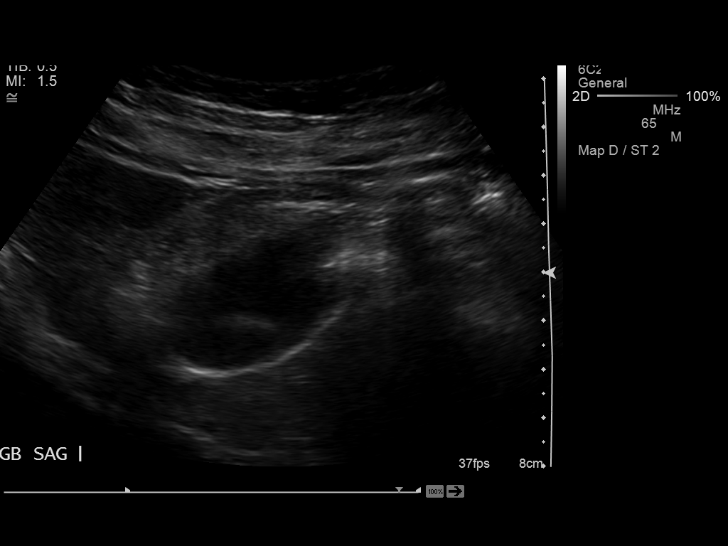
[im 73/117]
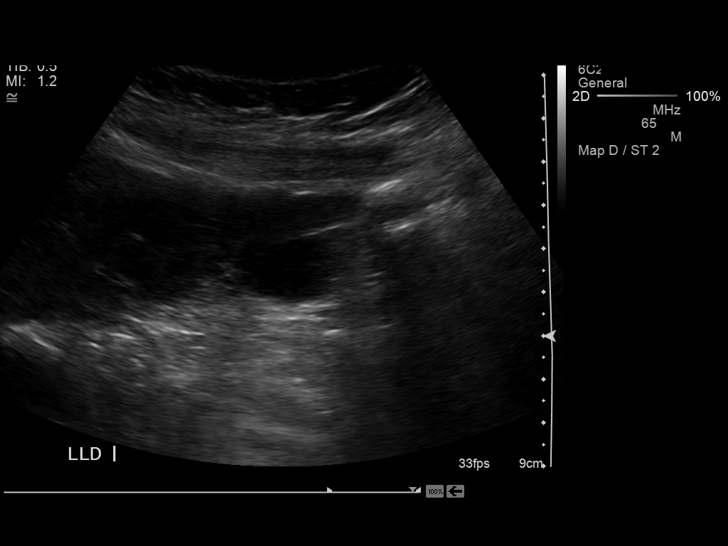
[im 78/117]
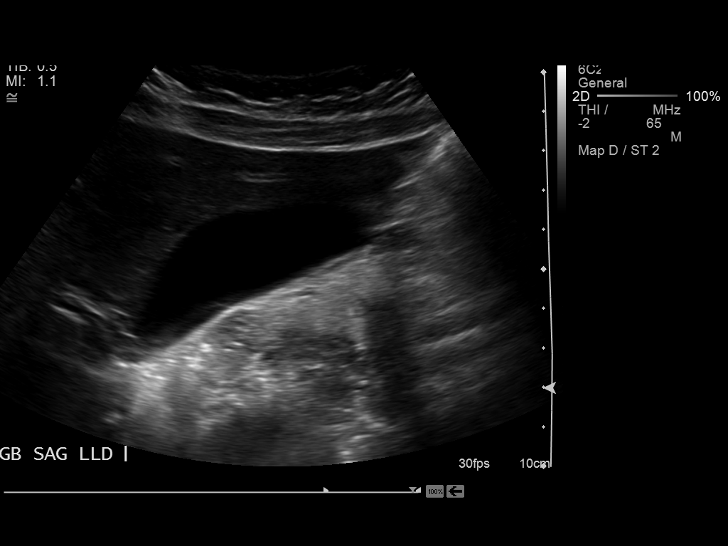
[im 88/117]
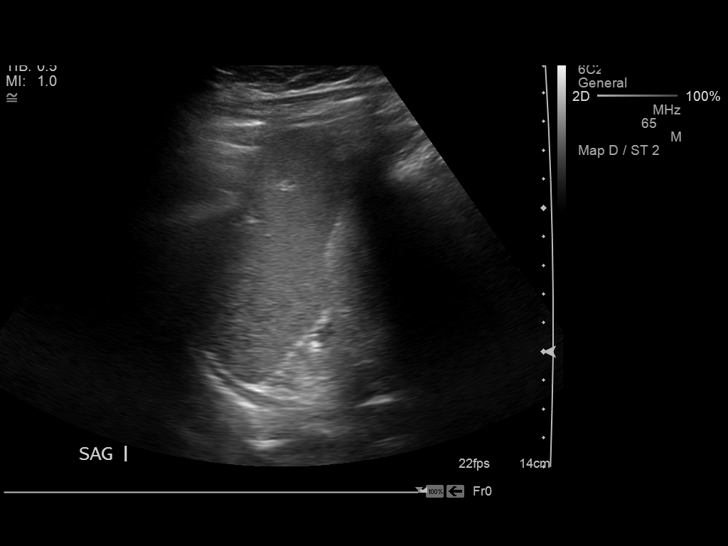
[im 97/117]
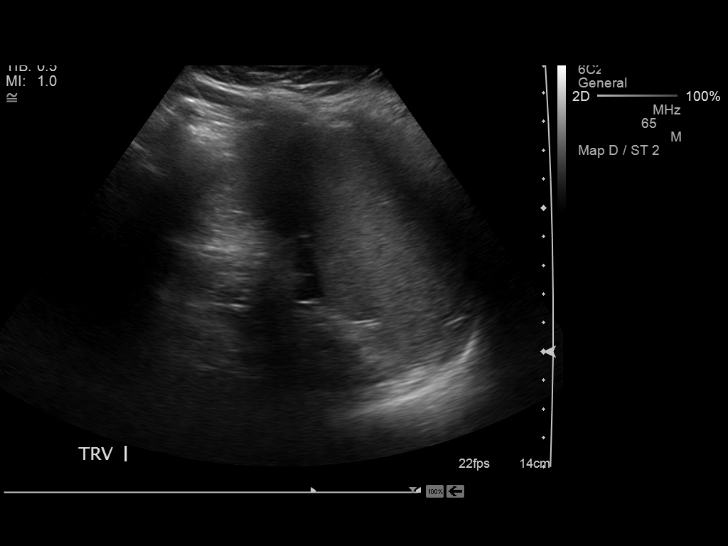
[im 107/117]
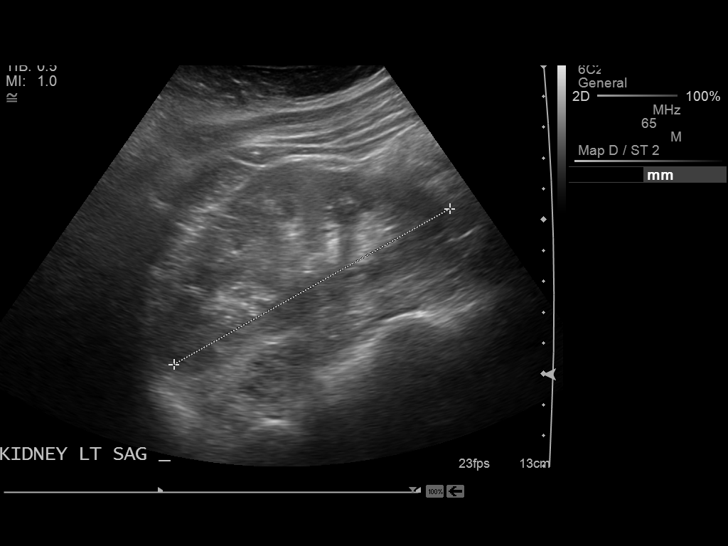
[im 117/117]
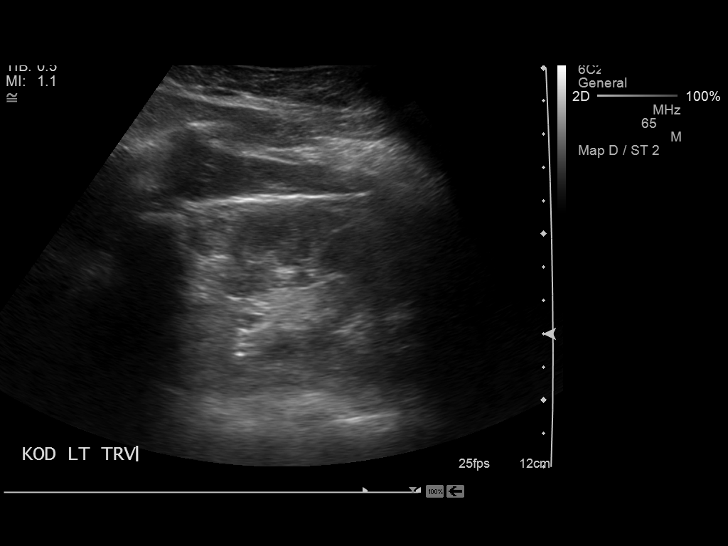

[14 of 25 positions shown; findings below may reference images not displayed]

FINDINGS: Gallbladder:  Normal, without gallstones, wall thickening or
pericholecystic fluid.

Common bile duct:  Measures

Liver:  Normal in caliber, measuring between 3.6 and 4.7 mm in
maximum diameter proximally.

IVC:  Appears normal.

Pancreas:  No focal abnormality seen.

Spleen:  Normal, measuring 6.4 cm in length.

Right Kidney:  Normal, measuring 11.4 cm in length.

Left Kidney:  Normal, measuring 11.5 cm in length.

Abdominal aorta:  No aneurysm identified.
IMPRESSION: Normal examination, unchanged.

## 2014-06-19 ENCOUNTER — Ambulatory Visit (INDEPENDENT_AMBULATORY_CARE_PROVIDER_SITE_OTHER): Payer: BC Managed Care – PPO | Admitting: Adult Health

## 2014-06-19 ENCOUNTER — Encounter: Payer: Self-pay | Admitting: Adult Health

## 2014-06-19 VITALS — BP 128/80 | HR 74 | Ht 64.0 in | Wt 156.0 lb

## 2014-06-19 DIAGNOSIS — Z01419 Encounter for gynecological examination (general) (routine) without abnormal findings: Secondary | ICD-10-CM

## 2014-06-19 NOTE — Patient Instructions (Signed)
Physical in  1 year Mammogram at 39 

## 2014-06-19 NOTE — Progress Notes (Signed)
Patient ID: Stephanie Paul, female   DOB: 10/31/1976, 38 y.o.   MRN: [id number] History of Present Illness:  Stephanie Paul is a 38 year ol white female, married in for a physical.Had normal pap with negative HPV 03/29/13.  Current Medications, Allergies, Past Medical History, Past Surgical History, Family History and Social History were reviewed in [employer] record.     Review of Systems: Patient denies any headaches, blurred vision, shortness of breath, chest pain, abdominal pain, problems with bowel movements, urination, or intercourse. No joint pain or mood swings, not using any birth control, is OK if gets pregnant.Would like to lose about 20 lbs.Has fitbit on.    Physical Exam:BP 128/80  Pulse 74  Ht 5\' 4"  (1.626 m)  Wt 156 lb (70.761 kg)  BMI 26.76 kg/m2  LMP 05/28/2014 General:  Well developed, well nourished, no acute distress Skin:  Warm and dry,tan Neck:  Midline trachea, normal thyroid Lungs; Clear to auscultation bilaterally Breast:  No dominant palpable mass, retraction, or nipple discharge Cardiovascular: Regular rate and rhythm Abdomen:  Soft, non tender, no hepatosplenomegaly Pelvic:  External genitalia is normal in appearance.  The vagina is normal in appearance.  The cervix is bulbous.  Uterus is felt to be normal size, shape, and contour.  No adnexal masses or tenderness noted. Extremities:  No swelling or varicosities noted Psych:  No mood changes,alert and cooperative,seems happy   Impression:  Yearly gyn exam no pap   Plan: Physical in  1 year Mammogram at 38  Try whole 30

## 2014-09-13 ENCOUNTER — Telehealth: Payer: Self-pay | Admitting: Adult Health

## 2014-09-13 MED ORDER — NAPROXEN SODIUM 550 MG PO TABS: 550.0000 mg | ORAL_TABLET | Freq: Two times a day (BID) | ORAL | Status: DC

## 2014-09-13 NOTE — Telephone Encounter (Signed)
Complains of bad period cramps and bleeding, worse the last 2 months has tried OTC, will rx anaprox ds and make appt for [place] and see me next Friday

## 2014-09-14 ENCOUNTER — Telehealth: Payer: Self-pay | Admitting: Adult Health

## 2014-09-14 MED ORDER — KETOROLAC TROMETHAMINE 10 MG PO TABS: 10.0000 mg | ORAL_TABLET | Freq: Three times a day (TID) | ORAL | Status: DC | PRN

## 2014-09-14 NOTE — Telephone Encounter (Signed)
Pt states was given Naproxen for severe cramping and bleeding not helping. Pt does have an ultrasound and appt with Derrek Monaco, NP next Friday. Pt requesting another medication to be prescribed.

## 2014-09-17 ENCOUNTER — Other Ambulatory Visit: Payer: Self-pay | Admitting: Obstetrics and Gynecology

## 2014-09-17 DIAGNOSIS — O3680X0 Pregnancy with inconclusive fetal viability, not applicable or unspecified: Secondary | ICD-10-CM

## 2014-09-19 ENCOUNTER — Telehealth: Payer: Self-pay | Admitting: Adult Health

## 2014-09-19 MED ORDER — HYDROCODONE-ACETAMINOPHEN 10-325 MG PO TABS: 1.0000 | ORAL_TABLET | Freq: Four times a day (QID) | ORAL | Status: DC | PRN

## 2014-09-19 NOTE — Telephone Encounter (Signed)
Has appt Friday for [place] is passing clots and has severe cramps will rx norco and keep appt, do not take with toradal

## 2014-09-21 ENCOUNTER — Other Ambulatory Visit: Payer: Self-pay | Admitting: Adult Health

## 2014-09-21 ENCOUNTER — Encounter: Payer: Self-pay | Admitting: Adult Health

## 2014-09-21 ENCOUNTER — Ambulatory Visit (INDEPENDENT_AMBULATORY_CARE_PROVIDER_SITE_OTHER): Payer: BC Managed Care – PPO

## 2014-09-21 ENCOUNTER — Ambulatory Visit (INDEPENDENT_AMBULATORY_CARE_PROVIDER_SITE_OTHER): Payer: BC Managed Care – PPO | Admitting: Adult Health

## 2014-09-21 ENCOUNTER — Other Ambulatory Visit: Payer: Self-pay | Admitting: Obstetrics and Gynecology

## 2014-09-21 VITALS — BP 136/88 | Ht 63.25 in | Wt 158.0 lb

## 2014-09-21 DIAGNOSIS — N938 Other specified abnormal uterine and vaginal bleeding: Secondary | ICD-10-CM

## 2014-09-21 DIAGNOSIS — O3680X0 Pregnancy with inconclusive fetal viability, not applicable or unspecified: Secondary | ICD-10-CM

## 2014-09-21 DIAGNOSIS — N946 Dysmenorrhea, unspecified: Secondary | ICD-10-CM

## 2014-09-21 DIAGNOSIS — N83201 Unspecified ovarian cyst, right side: Secondary | ICD-10-CM

## 2014-09-21 DIAGNOSIS — N83202 Unspecified ovarian cyst, left side: Secondary | ICD-10-CM

## 2014-09-21 DIAGNOSIS — N8320 Unspecified ovarian cysts: Secondary | ICD-10-CM

## 2014-09-21 HISTORY — DX: Unspecified ovarian cyst, left side: N83.202

## 2014-09-21 HISTORY — DX: Dysmenorrhea, unspecified: N94.6

## 2014-09-21 NOTE — Patient Instructions (Signed)
Ovarian Cyst An ovarian cyst is a fluid-filled sac that forms on an ovary. The ovaries are small organs that produce eggs in women. Various types of cysts can form on the ovaries. Most are not cancerous. Many do not cause problems, and they often go away on their own. Some may cause symptoms and require treatment. Common types of ovarian cysts include:  Functional cysts--These cysts may occur every month during the menstrual cycle. This is normal. The cysts usually go away with the next menstrual cycle if the woman does not get pregnant. Usually, there are no symptoms with a functional cyst.  Endometrioma cysts--These cysts form from the tissue that lines the uterus. They are also called "chocolate cysts" because they become filled with blood that turns brown. This type of cyst can cause pain in the lower abdomen during intercourse and with your menstrual period.  Cystadenoma cysts--This type develops from the cells on the outside of the ovary. These cysts can get very big and cause lower abdomen pain and pain with intercourse. This type of cyst can twist on itself, cut off its blood supply, and cause severe pain. It can also easily rupture and cause a lot of pain.  Dermoid cysts--This type of cyst is sometimes found in both ovaries. These cysts may contain different kinds of body tissue, such as skin, teeth, hair, or cartilage. They usually do not cause symptoms unless they get very big.  Theca lutein cysts--These cysts occur when too much of a certain hormone (human chorionic gonadotropin) is produced and overstimulates the ovaries to produce an egg. This is most common after procedures used to assist with the conception of a baby (in vitro fertilization). CAUSES   Fertility drugs can cause a condition in which multiple large cysts are formed on the ovaries. This is called ovarian hyperstimulation syndrome.  A condition called polycystic ovary syndrome can cause hormonal imbalances that can lead to  nonfunctional ovarian cysts. SIGNS AND SYMPTOMS  Many ovarian cysts do not cause symptoms. If symptoms are present, they may include:  Pelvic pain or pressure.  Pain in the lower abdomen.  Pain during sexual intercourse.  Increasing girth (swelling) of the abdomen.  Abnormal menstrual periods.  Increasing pain with menstrual periods.  Stopping having menstrual periods without being pregnant. DIAGNOSIS  These cysts are commonly found during a routine or annual pelvic exam. Tests may be ordered to find out more about the cyst. These tests may include:  Ultrasound.  X-ray of the pelvis.  CT scan.  MRI.  Blood tests. TREATMENT  Many ovarian cysts go away on their own without treatment. Your health care provider may want to check your cyst regularly for 2-3 months to see if it changes. For women in menopause, it is particularly important to monitor a cyst closely because of the higher rate of ovarian cancer in menopausal women. When treatment is needed, it may include any of the following:  A procedure to drain the cyst (aspiration). This may be done using a long needle and ultrasound. It can also be done through a laparoscopic procedure. This involves using a thin, lighted tube with a tiny camera on the end (laparoscope) inserted through a small incision.  Surgery to remove the whole cyst. This may be done using laparoscopic surgery or an open surgery involving a larger incision in the lower abdomen.  Hormone treatment or birth control pills. These methods are sometimes used to help dissolve a cyst. HOME CARE INSTRUCTIONS   Only take over-the-counter   or prescription medicines as directed by your health care provider.  Follow up with your health care provider as directed.  Get regular pelvic exams and Pap tests. SEEK MEDICAL CARE IF:   Your periods are late, irregular, or painful, or they stop.  Your pelvic pain or abdominal pain does not go away.  Your abdomen becomes  larger or swollen.  You have pressure on your bladder or trouble emptying your bladder completely.  You have pain during sexual intercourse.  You have feelings of fullness, pressure, or discomfort in your stomach.  You lose weight for no apparent reason.  You feel generally ill.  You become constipated.  You lose your appetite.  You develop acne.  You have an increase in body and facial hair.  You are gaining weight, without changing your exercise and eating habits.  You think you are pregnant. SEEK IMMEDIATE MEDICAL CARE IF:   You have increasing abdominal pain.  You feel sick to your stomach (nauseous), and you throw up (vomit).  You develop a fever that comes on suddenly.  You have abdominal pain during a bowel movement.  Your menstrual periods become heavier than usual. MAKE SURE YOU:  Understand these instructions.  Will watch your condition.  Will get help right away if you are not doing well or get worse. Document Released: 11/30/2005 Document Revised: 12/05/2013 Document Reviewed: 08/07/2013 [facility] Patient Information 2015 [place], [place]. This information is not intended to replace advice given to you by your health care provider. Make sure you discuss any questions you have with your health care provider. Follow up in 8 weeks for [place] and see me

## 2014-09-21 NOTE — Progress Notes (Signed)
Subjective:     Patient ID: Stephanie Paul, female   DOB: 10-30-1976, 38 y.o.   MRN: [id number]  HPI Stephanie Paul is 38 year old white female in for [place] for bad cramps and passing clots, the norco has helped.Is feeling better.  Review of Systems See HPI Reviewed past medical,surgical, social and family history. Reviewed medications and allergies.     Objective:   Physical Exam BP 136/88  Ht 5' 3.25" (1.607 m)  Wt 158 lb (71.668 kg)  BMI 27.75 kg/m2  LMP 10/04/2015Reviewed [place] with pt.   Uterus 8.5 x 5.6 x 4.0 cm, anteverted no myometrial masses noted  Endometrium 3.6 mm, symmetrical,  Right ovary 2.5 x 1.7 x 1.4 cm, with 1.8 x 1.6 x 1.1 cm simple cyst noted  Left ovary 2.7 x 2.3 x 1.7 cm, with 1.6 x 1.3cm complex cyst noted with internal debris noted  No free fluid noted within the pelvis  Technician Comments:  Anteverted homogeneous uterus, Endom-3.74mm symmetrical, Rt ovary with 1.8 x 1.6 x 1.1 cm simple cyst noted, Lt ovary with 1.6 x 1.3cm complex cyst noted with internal debris noted, No free fluid noted within the pelvis.Discussed birth control to shut ovaries down, declines that so will follow for now.    Assessment:     Period cramps Left ovarian cyst Right ovarian cyst    Plan:     Continue pain meds if needed Follow up in 2 months for F/U US and see me

## 2014-10-11 ENCOUNTER — Telehealth: Payer: Self-pay | Admitting: Adult Health

## 2014-10-11 MED ORDER — HYDROCODONE-ACETAMINOPHEN 10-325 MG PO TABS: 1.0000 | ORAL_TABLET | Freq: Four times a day (QID) | ORAL | Status: DC | PRN

## 2014-10-11 NOTE — Telephone Encounter (Signed)
Shemiah called has pain with cyst requests refill of Norco will refill x 1

## 2014-10-15 ENCOUNTER — Encounter: Payer: Self-pay | Admitting: Adult Health

## 2014-11-12 ENCOUNTER — Telehealth: Payer: Self-pay | Admitting: Adult Health

## 2014-11-12 MED ORDER — HYDROCODONE-ACETAMINOPHEN 10-325 MG PO TABS: 1.0000 | ORAL_TABLET | Freq: Four times a day (QID) | ORAL | Status: DC | PRN

## 2014-11-12 NOTE — Telephone Encounter (Signed)
Still having pain has appt 12/8 for [place] and see me, will refill norco

## 2014-11-19 ENCOUNTER — Other Ambulatory Visit: Payer: Self-pay | Admitting: Adult Health

## 2014-11-19 DIAGNOSIS — N83201 Unspecified ovarian cyst, right side: Secondary | ICD-10-CM

## 2014-11-19 DIAGNOSIS — N83202 Unspecified ovarian cyst, left side: Principal | ICD-10-CM

## 2014-11-21 ENCOUNTER — Encounter: Payer: Self-pay | Admitting: Adult Health

## 2014-11-21 ENCOUNTER — Ambulatory Visit (INDEPENDENT_AMBULATORY_CARE_PROVIDER_SITE_OTHER): Payer: BC Managed Care – PPO

## 2014-11-21 ENCOUNTER — Ambulatory Visit (INDEPENDENT_AMBULATORY_CARE_PROVIDER_SITE_OTHER): Payer: BC Managed Care – PPO | Admitting: Adult Health

## 2014-11-21 VITALS — BP 140/90 | Ht 63.0 in | Wt 160.0 lb

## 2014-11-21 DIAGNOSIS — N8320 Unspecified ovarian cysts: Secondary | ICD-10-CM

## 2014-11-21 DIAGNOSIS — Z3202 Encounter for pregnancy test, result negative: Secondary | ICD-10-CM

## 2014-11-21 DIAGNOSIS — N83201 Unspecified ovarian cyst, right side: Secondary | ICD-10-CM

## 2014-11-21 DIAGNOSIS — N946 Dysmenorrhea, unspecified: Secondary | ICD-10-CM

## 2014-11-21 DIAGNOSIS — N83202 Unspecified ovarian cyst, left side: Principal | ICD-10-CM

## 2014-11-21 DIAGNOSIS — Z308 Encounter for other contraceptive management: Secondary | ICD-10-CM

## 2014-11-21 DIAGNOSIS — Z7689 Persons encountering health services in other specified circumstances: Secondary | ICD-10-CM

## 2014-11-21 LAB — POCT URINE PREGNANCY: Preg Test, Ur: NEGATIVE

## 2014-11-21 MED ORDER — NORETHIN ACE-ETH ESTRAD-FE 1-20 MG-MCG(24) PO CHEW: 1.0000 | CHEWABLE_TABLET | Freq: Every day | ORAL | Status: DC

## 2014-11-21 NOTE — Progress Notes (Signed)
Subjective:     Patient ID: Stephanie Paul, female   DOB: October 29, 1976, 38 y.o.   MRN: [id number]  HPI Stephanie Paul is a 38 year old white female in for [place] for ovarian cysts and period cramps.Period pain worse and is having clots.  Review of Systems See HPI Reviewed past medical,surgical, social and family history. Reviewed medications and allergies.     Objective:   Physical Exam BP 140/90 mmHg  Ht 5\' 3"  (1.6 m)  Wt 160 lb (72.576 kg)  BMI 28.35 kg/m2  LMP 12/03/2015UPT negative. [place] reviewed with pt.      GYNECOLOGIC SONOGRAM   Stephanie Paul is a 38 y.o. G1P1 LMP 11/15/2014 for a pelvic sonogram for follow up bilateral adnexal cysts.  Uterus Anteverted uterus  Endometrium 3.7 mm, symmetrical, 2.8  Right ovary 2.8 x 2.6 x 2.0 cm, with 1.7 x 1.3cm simple cyst remains  Left ovary 2.7 x 2.1 x 1.5 cm, cyst resolved   No free fluid noted within the pelvis  Technician Comments:  Anteverted uterus,endometrium symmetrical 3.61mm, Rt adnexal cyst remains=1.7 x 1.3 cm simple appearing, Lt ovary appears WNL cyst resolved, no free fluid noted within the pelvis Discussed OCs and ablation will try OCs and review handout on ablation     Assessment:     Period cramps Right ovarian simple cyst Period management    Plan:     Gave Minastrin 3 packs to start today, lot [id number] A exp 7/16 Follow up in 10 weeks Review handout on ablation Use condoms

## 2014-11-21 NOTE — Patient Instructions (Signed)
Start OCs today Follow up in 10 weeks  Use condoms

## 2014-12-31 ENCOUNTER — Telehealth: Payer: Self-pay | Admitting: Adult Health

## 2014-12-31 NOTE — Telephone Encounter (Signed)
Had BTB with minastrin continue talking, this only second pack, and did miss a pill.

## 2015-01-17 ENCOUNTER — Other Ambulatory Visit: Payer: Self-pay | Admitting: *Deleted

## 2015-01-17 MED ORDER — NORETHIN ACE-ETH ESTRAD-FE 1-20 MG-MCG(24) PO CHEW: 1.0000 | CHEWABLE_TABLET | Freq: Every day | ORAL | Status: DC

## 2015-01-30 ENCOUNTER — Ambulatory Visit (INDEPENDENT_AMBULATORY_CARE_PROVIDER_SITE_OTHER): Payer: BLUE CROSS/BLUE SHIELD | Admitting: Adult Health

## 2015-01-30 ENCOUNTER — Encounter: Payer: Self-pay | Admitting: Adult Health

## 2015-01-30 VITALS — BP 120/80 | Ht 63.0 in | Wt 168.0 lb

## 2015-01-30 DIAGNOSIS — Z309 Encounter for contraceptive management, unspecified: Secondary | ICD-10-CM | POA: Insufficient documentation

## 2015-01-30 DIAGNOSIS — N946 Dysmenorrhea, unspecified: Secondary | ICD-10-CM

## 2015-01-30 DIAGNOSIS — Z3041 Encounter for surveillance of contraceptive pills: Secondary | ICD-10-CM

## 2015-01-30 HISTORY — DX: Encounter for contraceptive management, unspecified: Z30.9

## 2015-01-30 MED ORDER — NORETHIN ACE-ETH ESTRAD-FE 1-20 MG-MCG(24) PO CHEW: 1.0000 | CHEWABLE_TABLET | Freq: Every day | ORAL | Status: DC

## 2015-01-30 NOTE — Progress Notes (Signed)
Subjective:     Patient ID: Stephanie Paul, female   DOB: 1975-12-29, 39 y.o.   MRN: [id number]  HPI Stephanie Paul is a 39 year old white female back in follow up of starting minastrin for period cramps and had has ovarian cysts on past.She says she is much better and wants to continue the pill, did have 1 episode of BTB.  Review of Systems  Patient denies any headaches, hearing loss, fatigue, blurred vision, shortness of breath, chest pain, abdominal pain, problems with bowel movements, urination, or intercourse. No joint pain or mood swings.See HPI. Reviewed past medical,surgical, social and family history. Reviewed medications and allergies.     Objective:   Physical Exam BP 120/80 mmHg  Ht 5\' 3"  (1.6 m)  Wt 168 lb (76.204 kg)  BMI 29.77 kg/m2  LMP 01/02/2015   Skin warm and dry. Lungs: clear to ausculation bilaterally. Cardiovascular: regular rate and rhythm.Wants to stay on the pill,feels better.  Assessment:     Menstrual cramps resolved Contraceptive management    Plan:     Refilled minastrin x 1 year Return in 1 year for pap and physical

## 2015-01-30 NOTE — Patient Instructions (Signed)
Follow up in 1 year for pap and physical

## 2015-03-07 ENCOUNTER — Telehealth: Payer: Self-pay | Admitting: Adult Health

## 2015-03-07 MED ORDER — NORGESTIMATE-ETH ESTRADIOL 0.25-35 MG-MCG PO TABS: 1.0000 | ORAL_TABLET | Freq: Every day | ORAL | Status: DC

## 2015-03-07 NOTE — Telephone Encounter (Signed)
Spoke with pt. Pt has been on Minastrin x 5 months. She hasn't missed any pills. Her cramps are worse and she is having break through bleeding. Having to take Ketorolac for cramps. Please advise. Thanks!! [place]

## 2015-03-07 NOTE — Telephone Encounter (Signed)
Having BTB and cramps with minastrin,  will change to spritnec

## 2015-04-21 ENCOUNTER — Encounter (HOSPITAL_COMMUNITY): Payer: Self-pay | Admitting: Emergency Medicine

## 2015-04-21 ENCOUNTER — Emergency Department (HOSPITAL_COMMUNITY)
Admission: EM | Admit: 2015-04-21 | Discharge: 2015-04-21 | Disposition: A | Discharge status: Home / Self Care | Payer: BLUE CROSS/BLUE SHIELD | Attending: Emergency Medicine | Admitting: Emergency Medicine

## 2015-04-21 DIAGNOSIS — Z23 Encounter for immunization: Secondary | ICD-10-CM | POA: Diagnosis not present

## 2015-04-21 DIAGNOSIS — N8320 Unspecified ovarian cysts: Secondary | ICD-10-CM | POA: Diagnosis not present

## 2015-04-21 DIAGNOSIS — Y998 Other external cause status: Secondary | ICD-10-CM | POA: Insufficient documentation

## 2015-04-21 DIAGNOSIS — Y9389 Activity, other specified: Secondary | ICD-10-CM | POA: Insufficient documentation

## 2015-04-21 DIAGNOSIS — Z880 Allergy status to penicillin: Secondary | ICD-10-CM | POA: Insufficient documentation

## 2015-04-21 DIAGNOSIS — Z7951 Long term (current) use of inhaled steroids: Secondary | ICD-10-CM | POA: Diagnosis not present

## 2015-04-21 DIAGNOSIS — Y9289 Other specified places as the place of occurrence of the external cause: Secondary | ICD-10-CM | POA: Diagnosis not present

## 2015-04-21 DIAGNOSIS — J45909 Unspecified asthma, uncomplicated: Secondary | ICD-10-CM | POA: Insufficient documentation

## 2015-04-21 DIAGNOSIS — S61210A Laceration without foreign body of right index finger without damage to nail, initial encounter: Secondary | ICD-10-CM | POA: Diagnosis not present

## 2015-04-21 DIAGNOSIS — Z87891 Personal history of nicotine dependence: Secondary | ICD-10-CM | POA: Diagnosis not present

## 2015-04-21 DIAGNOSIS — Z793 Long term (current) use of hormonal contraceptives: Secondary | ICD-10-CM | POA: Diagnosis not present

## 2015-04-21 DIAGNOSIS — W260XXA Contact with knife, initial encounter: Secondary | ICD-10-CM | POA: Diagnosis not present

## 2015-04-21 DIAGNOSIS — IMO0002 Reserved for concepts with insufficient information to code with codable children: Secondary | ICD-10-CM

## 2015-04-21 MED ORDER — TETANUS-DIPHTH-ACELL PERTUSSIS 5-2.5-18.5 LF-MCG/0.5 IM SUSP
0.5000 mL | Freq: Once | INTRAMUSCULAR | Status: AC
  Administered 2015-04-21: 0.5 mL via INTRAMUSCULAR
  Filled 2015-04-21: qty 0.5

## 2015-04-21 MED ORDER — LIDOCAINE HCL (PF) 2 % IJ SOLN
2.0000 mL | Freq: Once | INTRAMUSCULAR | Status: AC
  Administered 2015-04-21: 2 mL
  Filled 2015-04-21: qty 10

## 2015-04-21 MED ORDER — HYDROCODONE-ACETAMINOPHEN 5-325 MG PO TABS: 1.0000 | ORAL_TABLET | ORAL | Status: DC | PRN

## 2015-04-21 NOTE — ED Notes (Signed)
PA Julie at bedside.  

## 2015-04-21 NOTE — Discharge Instructions (Signed)
Fingertip Injuries and Amputations Fingertip injuries are common and often get injured because they are last to escape when pulling your hand out of harm's way. You have amputated (cut off) part of your finger. How this turns out depends largely on how much was amputated. If just the tip is amputated, often the end of the finger will grow back and the finger may return to much the same as it was before the injury.  If more of the finger is missing, your caregiver has done the best with the tissue remaining to allow you to keep as much finger as is possible. Your caregiver after checking your injury has tried to leave you with a painless fingertip that has durable, feeling skin. If possible, your caregiver has tried to maintain the finger's length and appearance and preserve its fingernail.  Please read the instructions outlined below and refer to this sheet in the next few weeks. These instructions provide you with general information on caring for yourself. Your caregiver may also give you specific instructions. While your treatment has been done according to the most current medical practices available, unavoidable complications occasionally occur. If you have any problems or questions after discharge, please call your caregiver. HOME CARE INSTRUCTIONS   You may resume normal diet and activities as directed or allowed.  Keep your hand elevated above the level of your heart. This helps decrease pain and swelling.  Keep ice packs (or a bag of ice wrapped in a towel) on the injured area for 15-20 minutes, 03-04 times per day, for the first two days.  Change dressings if necessary or as directed.  Clean the wound daily or as directed.  Only take over-the-counter or prescription medicines for pain, discomfort, or fever as directed by your caregiver.  Keep appointments as directed. SEEK IMMEDIATE MEDICAL CARE IF:  You develop redness, swelling, numbness or increasing pain in the wound.  There is  pus coming from the wound.  You develop an unexplained oral temperature above 102 F (38.9 C) or as your caregiver suggests.  There is a foul (bad) smell coming from the wound or dressing.  There is a breaking open of the wound (edges not staying together) after sutures or staples have been removed. MAKE SURE YOU:   Understand these instructions.  Will watch your condition.  Will get help right away if you are not doing well or get worse. Document Released: 10/21/2005 Document Revised: 02/22/2012 Document Reviewed: 09/19/2008 [facility] Patient Information 2015 [place], [place]. This information is not intended to replace advice given to you by your health care provider. Make sure you discuss any questions you have with your health care provider.   I do not expect you to have any complications from this injury - it should heal completely.  Get rechecked by your doctor or return here if you have any problems or concerns.  Keep the wound dry and covered and let the "scab" fall away when it it ready.

## 2015-04-21 NOTE — ED Provider Notes (Signed)
CSN: [id number]     Arrival date & time 04/21/15  1432 History  This chart was scribed for a non-physician practitioner, Evalee Jefferson, PA-C working with Tanna Furry, MD by Martinique Peace, ED Scribe. The patient was seen in APFT20/APFT20. The patient's care was started at 3:41 PM.    Chief Complaint  Patient presents with  . Laceration      Patient is a 39 y.o. female presenting with skin laceration. The history is provided by the patient. No language interpreter was used.  Laceration HPI Comments: Stephanie Paul is a 39 y.o. female who presents to the Emergency Department complaining of laceration to right index finger that occurred while she was chopping lettuce just prior to arrival. Pt reports small tip of affected finger is missing. Small amount of active bleeding still noted. Pain is constant and aching.  She has applied pressure to the wound site without complete hemostasis.  She is not current with her tetanus vaccine.  Past Medical History  Diagnosis Date  . Ovarian cyst, right   . Asthma   . Left ovarian cyst 09/21/2014  . Menstrual cramps 09/21/2014  . Contraceptive management 01/30/2015   History reviewed. No pertinent past surgical history. Family History  Problem Relation Age of Onset  . Cancer Paternal Grandfather     leukemia  . Hyperlipidemia Paternal Grandmother   . Hypertension Paternal Grandmother   . Diabetes Maternal Grandfather    History  Substance Use Topics  . Smoking status: Former Smoker -- 10 years    Types: Cigarettes  . Smokeless tobacco: Never Used     Comment: quit 2010 after smoking 10 yrs  . Alcohol Use: 1.2 oz/week    2 Cans of beer per week     Comment: occ   OB History    Gravida Para Term Preterm AB TAB SAB Ectopic Multiple Living   1 1        1      Review of Systems  Constitutional: Negative for fever.  HENT: Negative for congestion and sore throat.   Eyes: Negative.   Respiratory: Negative for chest tightness and shortness of  breath.   Cardiovascular: Negative for chest pain.  Gastrointestinal: Negative for nausea and abdominal pain.  Genitourinary: Negative.   Musculoskeletal: Negative for joint swelling, arthralgias and neck pain.  Skin: Positive for wound. Negative for rash.  Neurological: Negative for dizziness, weakness, light-headedness, numbness and headaches.  Psychiatric/Behavioral: Negative.       Allergies  Penicillins  Home Medications   Prior to Admission medications   Medication Sig Start Date End Date Taking? Authorizing Provider  albuterol (PROVENTIL HFA;VENTOLIN HFA) 108 (90 BASE) MCG/ACT inhaler Inhale 2 puffs into the lungs every 6 (six) hours as needed for wheezing or shortness of breath.   Yes Historical Provider, MD  beclomethasone (QVAR) 40 MCG/ACT inhaler Inhale 1 puff into the lungs as needed.   Yes Historical Provider, MD  Multiple Vitamin (MULTIVITAMIN) tablet Take 1 tablet by mouth daily.   Yes Historical Provider, MD  norgestimate-ethinyl estradiol (ORTHO-CYCLEN,SPRINTEC,PREVIFEM) 0.25-35 MG-MCG tablet Take 1 tablet by mouth daily. 03/07/15  Yes Estill Dooms, NP  HYDROcodone-acetaminophen (NORCO/VICODIN) 5-325 MG per tablet Take 1 tablet by mouth every 4 (four) hours as needed. 04/21/15   Evalee Jefferson, PA-C   BP 140/100 mmHg  Pulse 90  Temp(Src) 98.6 F (37 C) (Oral)  Resp 20  Ht 5\' 3"  (1.6 m)  Wt 160 lb (72.576 kg)  BMI 28.35 kg/m2  SpO2 100%  LMP 04/07/2015 Physical Exam  Constitutional: She appears well-developed and well-nourished.  HENT:  Head: Normocephalic and atraumatic.  Eyes: Conjunctivae are normal.  Neck: Normal range of motion.  Cardiovascular: Normal rate.   Pulmonary/Chest: Effort normal.  Musculoskeletal: Normal range of motion.  Neurological: She is alert.  Skin: Skin is warm and dry.  0.5cm shallow avulsion laceration to left radial side of index finger at finger tip. Barely involves nail edge.  Nail bed grossly intact.  Distal sensation intact.  Small amount of venous oozing from wound.   Psychiatric: She has a normal mood and affect.  Nursing note and vitals reviewed.   ED Course  Procedures (including critical care time)  LACERATION REPAIR Performed by: Evalee Jefferson Authorized by: Evalee Jefferson Consent: Verbal consent obtained. Risks and benefits: risks, benefits and alternatives were discussed Consent given by: patient Patient identity confirmed: provided demographic data Prepped and Draped in normal sterile fashion Wound explored  Laceration Location: left index finger  Laceration Length: 0.5 cm x 0.25 avulsion  No Foreign Bodies seen or palpated  Anesthesia: digital block  Local anesthetic: lidocaine 2% without epinephrine  Anesthetic total: 1.5 ml  Irrigation method: syringe Amount of cleaning: copious using NS after cleaning with Safe Clens  Skin closure: wound stop powder  Number of sutures: na  Technique: wound stop powder and pressure applied for 1 minute with complete hemostasis obtained.  Patient tolerance: Patient tolerated the procedure well with no immediate complications.   Labs Review Labs Reviewed - No data to display  Imaging Review No results found.   EKG Interpretation None      Medications  lidocaine (XYLOCAINE) 2 % injection 2 mL (2 mLs Other Given by Other 04/21/15 1550)  Tdap (BOOSTRIX) injection 0.5 mL (0.5 mLs Intramuscular Given 04/21/15 1657)    3:42 PM- Treatment plan was discussed with patient who verbalizes understanding and agrees.     MDM   Final diagnoses:  Laceration    Pt placed in bulky dressing. Instructed to keep finger dry, treat the powder seal as she would a scab.  F/u with pcp or return here for any problems or concerns.  Tetanus was updated.  I personally performed the services described in this documentation, which was scribed in my presence. The recorded information has been reviewed and is accurate.    Evalee Jefferson, PA-C 04/23/15 0737  Tanna Furry, MD 04/29/15 [phone]

## 2015-04-21 NOTE — ED Notes (Signed)
Patient cut right index finger with knife while chopping lettuce. Small area to tip of right index finder missing. Small amount of active bleeding noted.

## 2015-06-24 ENCOUNTER — Encounter: Payer: Self-pay | Admitting: Adult Health

## 2015-06-24 ENCOUNTER — Ambulatory Visit (INDEPENDENT_AMBULATORY_CARE_PROVIDER_SITE_OTHER): Payer: BLUE CROSS/BLUE SHIELD | Admitting: Adult Health

## 2015-06-24 VITALS — BP 120/80 | HR 80 | Ht 63.5 in | Wt 166.5 lb

## 2015-06-24 DIAGNOSIS — Z1212 Encounter for screening for malignant neoplasm of rectum: Secondary | ICD-10-CM | POA: Diagnosis not present

## 2015-06-24 DIAGNOSIS — Z01419 Encounter for gynecological examination (general) (routine) without abnormal findings: Secondary | ICD-10-CM

## 2015-06-24 LAB — HEMOCCULT GUIAC POC 1CARD (OFFICE): Fecal Occult Blood, POC: NEGATIVE

## 2015-06-24 NOTE — Progress Notes (Addendum)
Patient ID: Stephanie Paul, female   DOB: 1976-09-14, 39 y.o.   MRN: [id number] History of Present Illness: Stephanie Paul is a 23 year old white female, married in for  Well woman gyn exam,she had a normal pap 03/29/13.She stopped OCs in June, had low sex drive and was gaining some weight, sex drive better already after stopping pills.   Current Medications, Allergies, Past Medical History, Past Surgical History, Family History and Social History were reviewed in [employer] record.     Review of Systems: Patient denies any headaches, hearing loss, fatigue, blurred vision, shortness of breath, chest pain, abdominal pain, problems with bowel movements, urination, or intercourse. No joint pain or mood swings.See HPI for positives    Physical Exam:BP 120/80 mmHg  Pulse 80  Ht 5' 3.5" (1.613 m)  Wt 166 lb 8 oz (75.524 kg)  BMI 29.03 kg/m2  LMP 05/31/2015 General:  Well developed, well nourished, no acute distress Skin:  Warm and dry Neck:  Midline trachea, normal thyroid, good ROM, no lymphadenopathy Lungs; Clear to auscultation bilaterally Breast:  No dominant palpable mass, retraction, or nipple discharge Cardiovascular: Regular rate and rhythm Abdomen:  Soft, non tender, no hepatosplenomegaly Pelvic:  External genitalia is normal in appearance, no lesions.  The vagina is normal in appearance. Urethra has no lesions or masses. The cervix is bulbous.  Uterus is felt to be normal size, shape, and contour.  No adnexal masses or tenderness noted.Bladder is non tender, no masses felt. Rectal: Good sphincter tone, no polyps, or hemorrhoids felt.  Hemoccult negative.+rectocele Extremities/musculoskeletal:  No swelling or varicosities noted, no clubbing or cyanosis Psych:  No mood changes, alert and cooperative,seems happy   Impression: Well woman gyn exam no pap    Plan: Take prenatal vitamin or folic acid Get mammogram now Pap and physical in 1 year

## 2015-06-24 NOTE — Patient Instructions (Signed)
Take folic acid or PNV Get mammogram [phone] Pap and physical in  1 year

## 2015-10-11 ENCOUNTER — Other Ambulatory Visit: Payer: Self-pay | Admitting: Adult Health

## 2015-10-11 DIAGNOSIS — Z1231 Encounter for screening mammogram for malignant neoplasm of breast: Secondary | ICD-10-CM

## 2015-10-21 ENCOUNTER — Ambulatory Visit (HOSPITAL_COMMUNITY)
Admission: RE | Admit: 2015-10-21 | Discharge: 2015-10-21 | Disposition: A | Discharge status: Home / Self Care | Payer: BLUE CROSS/BLUE SHIELD | Source: Ambulatory Visit | Attending: Adult Health | Admitting: Adult Health

## 2015-10-21 DIAGNOSIS — Z1231 Encounter for screening mammogram for malignant neoplasm of breast: Secondary | ICD-10-CM | POA: Diagnosis not present

## 2015-10-23 ENCOUNTER — Other Ambulatory Visit: Payer: Self-pay | Admitting: Adult Health

## 2015-10-23 DIAGNOSIS — R928 Other abnormal and inconclusive findings on diagnostic imaging of breast: Secondary | ICD-10-CM

## 2015-10-30 ENCOUNTER — Ambulatory Visit
Admission: RE | Admit: 2015-10-30 | Discharge: 2015-10-30 | Disposition: A | Discharge status: Home / Self Care | Payer: BLUE CROSS/BLUE SHIELD | Source: Ambulatory Visit | Attending: Adult Health | Admitting: Adult Health

## 2015-10-30 DIAGNOSIS — R928 Other abnormal and inconclusive findings on diagnostic imaging of breast: Secondary | ICD-10-CM

## 2015-11-19 ENCOUNTER — Emergency Department (HOSPITAL_COMMUNITY)
Admission: EM | Admit: 2015-11-19 | Discharge: 2015-11-19 | Discharge status: Absent without leave | Payer: BLUE CROSS/BLUE SHIELD

## 2015-11-19 ENCOUNTER — Ambulatory Visit (HOSPITAL_COMMUNITY)
Admission: RE | Admit: 2015-11-19 | Discharge: 2015-11-19 | Disposition: A | Discharge status: Home / Self Care | Payer: BLUE CROSS/BLUE SHIELD | Source: Ambulatory Visit | Attending: Pulmonary Disease | Admitting: Pulmonary Disease

## 2015-11-19 ENCOUNTER — Other Ambulatory Visit (HOSPITAL_COMMUNITY): Payer: Self-pay | Admitting: Pulmonary Disease

## 2015-11-19 DIAGNOSIS — R911 Solitary pulmonary nodule: Secondary | ICD-10-CM | POA: Insufficient documentation

## 2015-11-19 DIAGNOSIS — R0602 Shortness of breath: Secondary | ICD-10-CM | POA: Insufficient documentation

## 2015-11-19 MED ORDER — IOHEXOL 350 MG/ML SOLN
100.0000 mL | Freq: Once | INTRAVENOUS | Status: AC | PRN
  Administered 2015-11-19: 100 mL via INTRAVENOUS

## 2016-05-27 ENCOUNTER — Encounter: Payer: Self-pay | Admitting: Adult Health

## 2016-05-27 ENCOUNTER — Ambulatory Visit (INDEPENDENT_AMBULATORY_CARE_PROVIDER_SITE_OTHER): Payer: BLUE CROSS/BLUE SHIELD | Admitting: Adult Health

## 2016-05-27 ENCOUNTER — Other Ambulatory Visit (HOSPITAL_COMMUNITY)
Admission: RE | Admit: 2016-05-27 | Discharge: 2016-05-27 | Disposition: A | Discharge status: Home / Self Care | Payer: BLUE CROSS/BLUE SHIELD | Source: Ambulatory Visit | Attending: Adult Health | Admitting: Adult Health

## 2016-05-27 VITALS — BP 130/82 | HR 82 | Ht 64.0 in | Wt 170.0 lb

## 2016-05-27 DIAGNOSIS — Z01419 Encounter for gynecological examination (general) (routine) without abnormal findings: Secondary | ICD-10-CM | POA: Insufficient documentation

## 2016-05-27 DIAGNOSIS — Z1212 Encounter for screening for malignant neoplasm of rectum: Secondary | ICD-10-CM | POA: Diagnosis not present

## 2016-05-27 DIAGNOSIS — Z1151 Encounter for screening for human papillomavirus (HPV): Secondary | ICD-10-CM | POA: Diagnosis present

## 2016-05-27 DIAGNOSIS — K602 Anal fissure, unspecified: Secondary | ICD-10-CM | POA: Insufficient documentation

## 2016-05-27 DIAGNOSIS — K649 Unspecified hemorrhoids: Secondary | ICD-10-CM

## 2016-05-27 HISTORY — DX: Unspecified hemorrhoids: K64.9

## 2016-05-27 HISTORY — DX: Anal fissure, unspecified: K60.2

## 2016-05-27 LAB — HEMOCCULT GUIAC POC 1CARD (OFFICE): FECAL OCCULT BLD: NEGATIVE

## 2016-05-27 MED ORDER — HYDROCORTISONE ACE-PRAMOXINE 2.5-1 % RE CREA
1.0000 | TOPICAL_CREAM | Freq: Three times a day (TID) | RECTAL | Status: DC
Start: 2016-05-27 — End: 2018-04-12

## 2016-05-27 NOTE — Patient Instructions (Signed)
Physical in 1 year, pap in 3 if normal Mammogram yearly Use analpram tid

## 2016-05-27 NOTE — Progress Notes (Signed)
Patient ID: Stephanie Paul, female   DOB: 12/30/1975, 40 y.o.   MRN: [id number] History of Present Illness: Stephanie Paul is a 30 year old white female, married in for a well woman gyn exam and pap.She is complaining of ?hemorrhoid, has some pain in that area.   Current Medications, Allergies, Past Medical History, Past Surgical History, Family History and Social History were reviewed in [employer] record.     Review of Systems: Patient denies any headaches, hearing loss, fatigue, blurred vision, shortness of breath, chest pain, abdominal pain, problems with bowel movements(had been hard,using stool softener), urination, or intercourse. No joint pain or mood swings.See HPI for positives.    Physical Exam:BP 130/82 mmHg  Pulse 82  Ht 5\' 4"  (1.626 m)  Wt 170 lb (77.111 kg)  BMI 29.17 kg/m2  LMP 05/22/2016 (Exact Date) General:  Well developed, well nourished, no acute distress Skin:  Warm and dry,tan Neck:  Midline trachea, normal thyroid, good ROM, no lymphadenopathy Lungs; Clear to auscultation bilaterally Breast:  No dominant palpable mass, retraction, or nipple discharge Cardiovascular: Regular rate and rhythm Abdomen:  Soft, non tender, no hepatosplenomegaly Pelvic:  External genitalia is normal in appearance, no lesions.  The vagina is normal in appearance. Urethra has no lesions or masses. The cervix is bulbous. Pap with HPV performed. Uterus is felt to be normal size, shape, and contour.  No adnexal masses or tenderness noted.Bladder is non tender, no masses felt. Rectal: Good sphincter tone, no polyps, internal hemorrhoids felt,has rectal fissure.  Hemoccult negative. Extremities/musculoskeletal:  No swelling or varicosities noted, no clubbing or cyanosis Psych:  No mood changes, alert and cooperative,seems happy   Impression: Well woman gyn exam and pap Hemorrhoids Anal fissure     Plan: Rx analpram HC 2.5-1 % cream, use tid prn #30 gm with 3  refills Physical in 1 year,pap in 3 if normal Mammogram yearly Call if not better, and continue stool softeners

## 2016-05-28 LAB — CYTOLOGY - PAP

## 2016-09-28 ENCOUNTER — Other Ambulatory Visit (HOSPITAL_COMMUNITY): Payer: Self-pay | Admitting: Family Medicine

## 2016-09-28 DIAGNOSIS — Z1231 Encounter for screening mammogram for malignant neoplasm of breast: Secondary | ICD-10-CM

## 2016-10-30 ENCOUNTER — Ambulatory Visit (HOSPITAL_COMMUNITY)
Admission: RE | Admit: 2016-10-30 | Discharge: 2016-10-30 | Disposition: A | Discharge status: Home / Self Care | Payer: BLUE CROSS/BLUE SHIELD | Source: Ambulatory Visit | Attending: Family Medicine | Admitting: Family Medicine

## 2016-10-30 DIAGNOSIS — R928 Other abnormal and inconclusive findings on diagnostic imaging of breast: Secondary | ICD-10-CM | POA: Insufficient documentation

## 2016-10-30 DIAGNOSIS — Z1231 Encounter for screening mammogram for malignant neoplasm of breast: Secondary | ICD-10-CM | POA: Insufficient documentation

## 2016-11-04 ENCOUNTER — Other Ambulatory Visit: Payer: Self-pay | Admitting: Family Medicine

## 2016-11-04 DIAGNOSIS — R928 Other abnormal and inconclusive findings on diagnostic imaging of breast: Secondary | ICD-10-CM

## 2016-11-24 ENCOUNTER — Ambulatory Visit (HOSPITAL_COMMUNITY)
Admission: RE | Admit: 2016-11-24 | Discharge: 2016-11-24 | Disposition: A | Discharge status: Home / Self Care | Payer: BLUE CROSS/BLUE SHIELD | Source: Ambulatory Visit | Attending: Family Medicine | Admitting: Family Medicine

## 2016-11-24 DIAGNOSIS — R928 Other abnormal and inconclusive findings on diagnostic imaging of breast: Secondary | ICD-10-CM | POA: Diagnosis present

## 2017-01-02 IMAGING — MG MM DIGITAL SCREENING
2 series · 2 of 2 positions shown · non-contrast
Comparison: No prior exams.

CLINICAL DATA: Screening. Baseline screening mammogram.

EXAM:
DIGITAL SCREENING BILATERAL MAMMOGRAM WITH CAD

[R MLO]
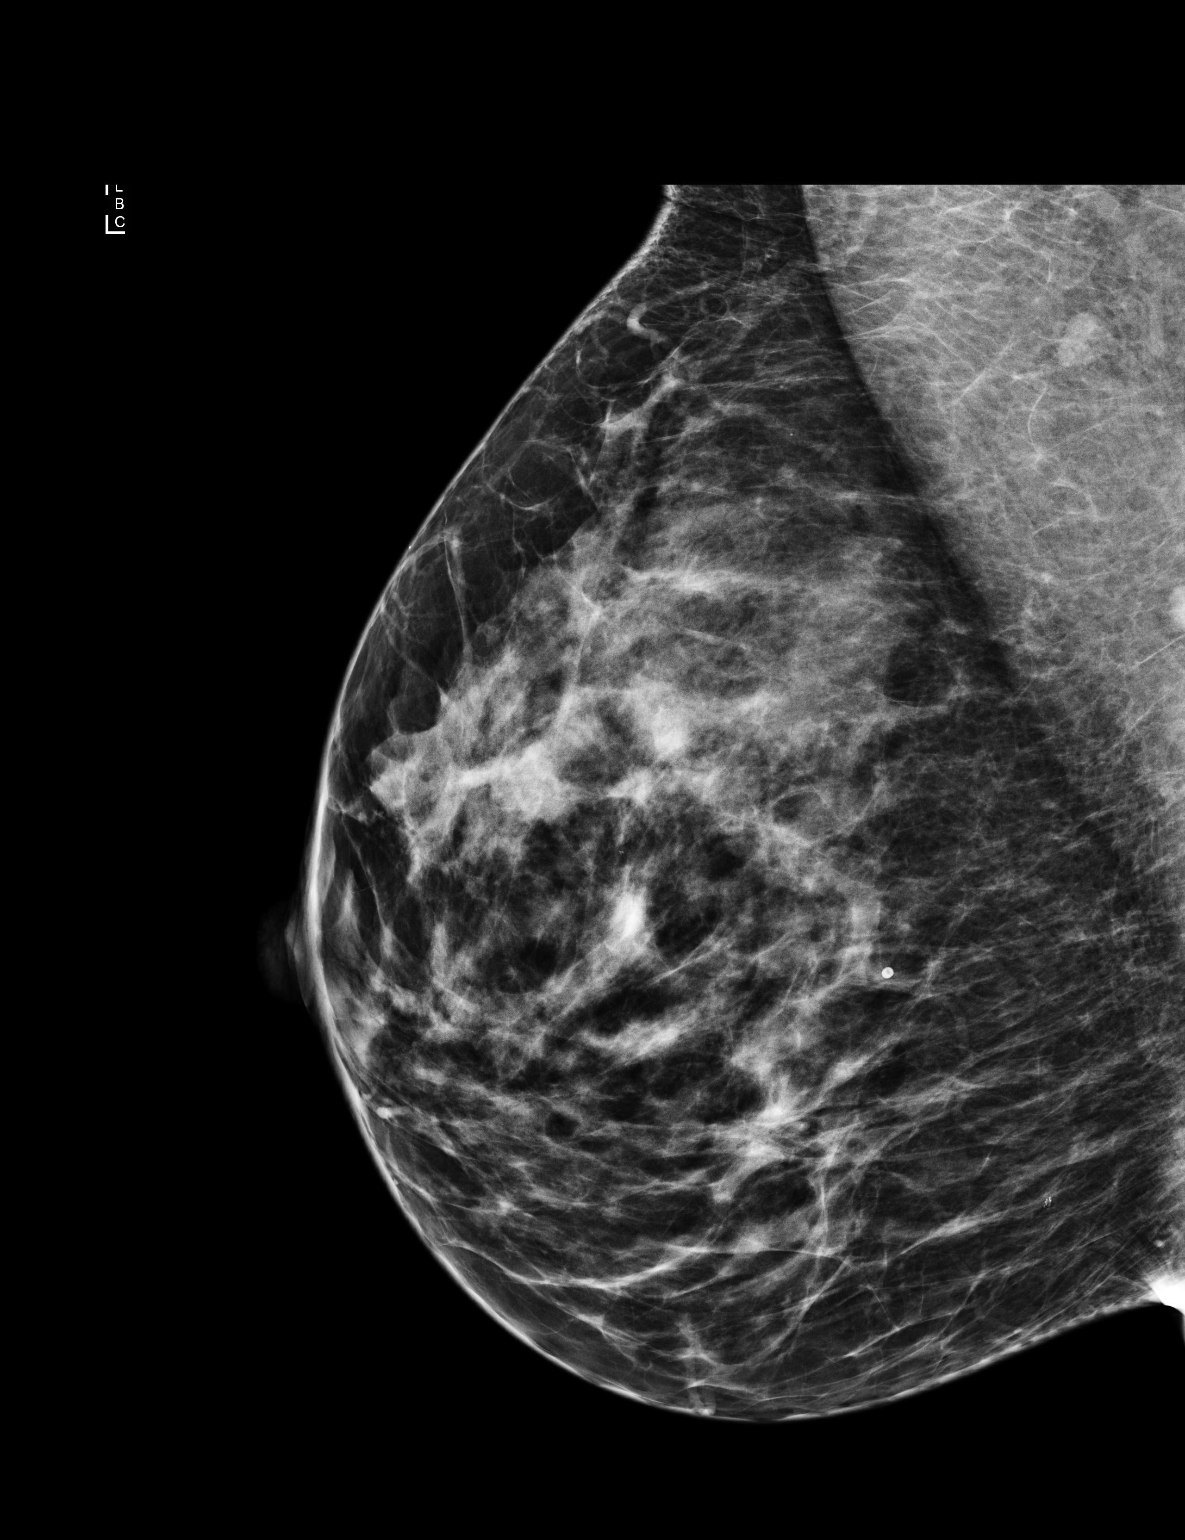

[R CC]
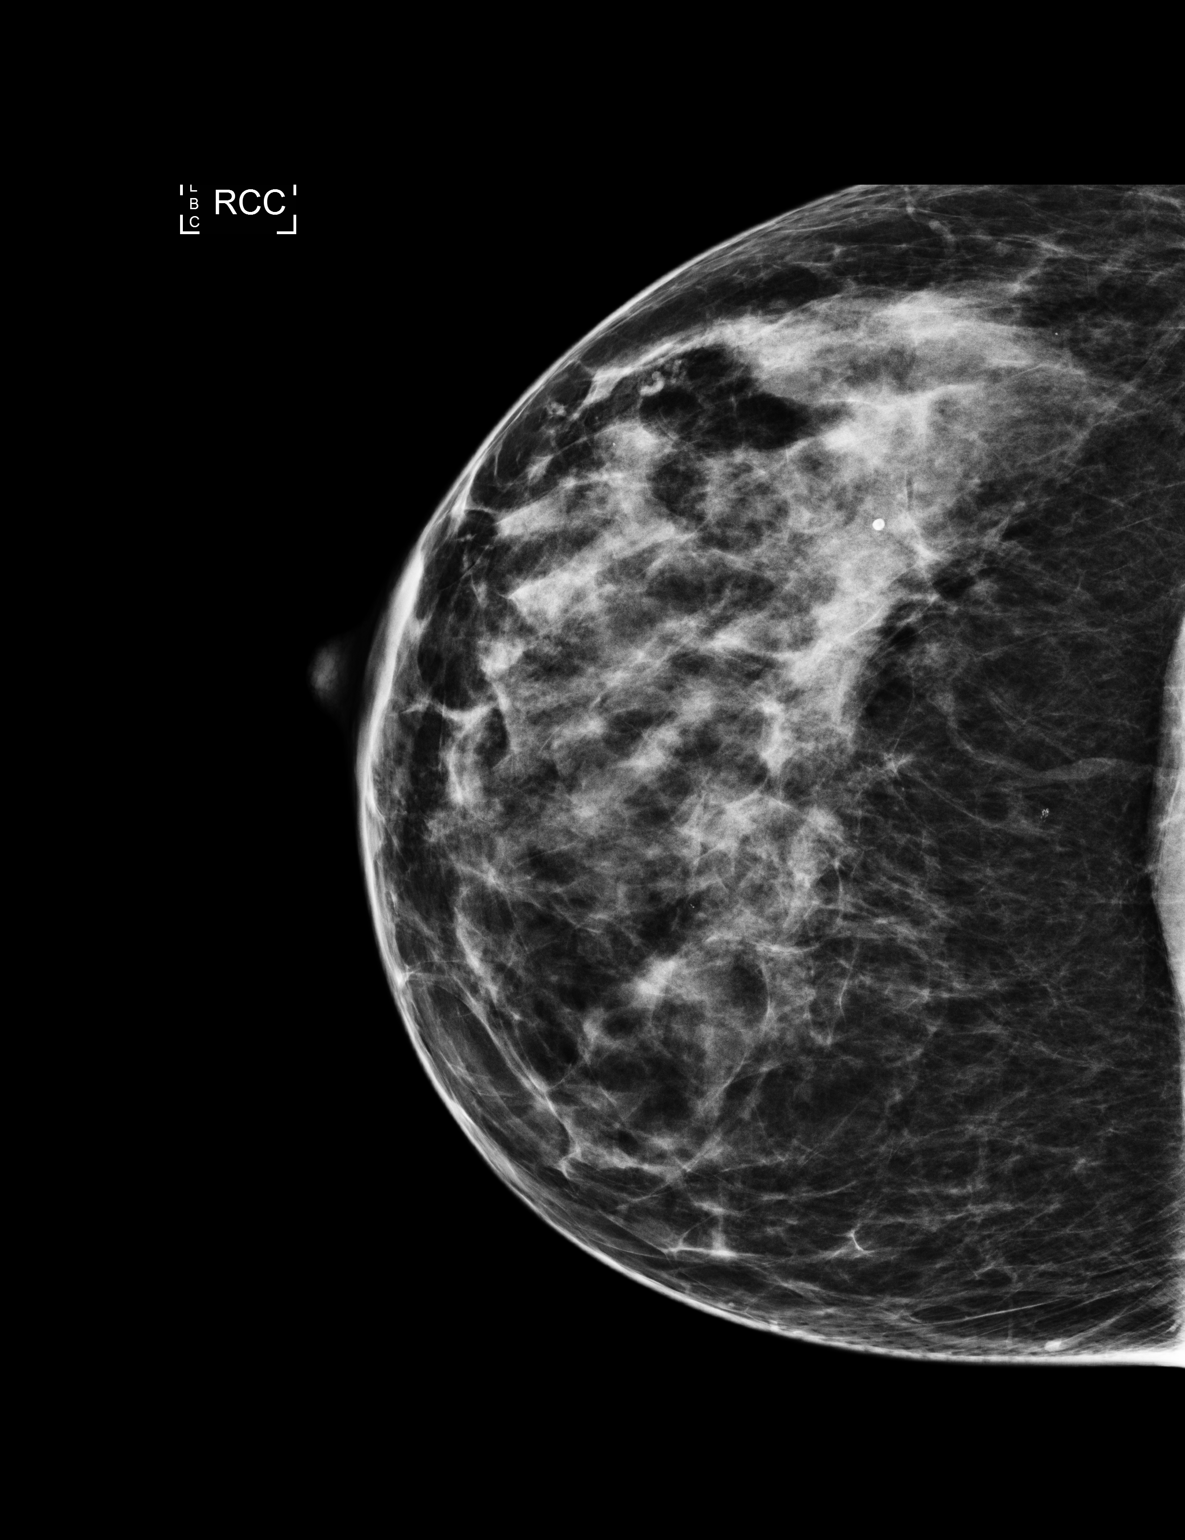

[2 of 2 positions shown; findings below may reference images not displayed]

This is a baseline mammogram.

ACR Breast Density Category c: The breast tissue is heterogeneously
dense, which may obscure small masses.
FINDINGS: In the right breast, calcifications warrant further evaluation with
magnified views. In the left breast, no findings suspicious for
malignancy. Images were processed with CAD.
IMPRESSION: Further evaluation is suggested for calcifications in the right
breast.

RECOMMENDATION:
Diagnostic mammogram of the right breast. (Code:[id number]-TTB)

The patient will be contacted regarding the findings, and additional
imaging will be scheduled.

BI-RADS CATEGORY  0: Incomplete. Need additional imaging evaluation
and/or prior mammograms for comparison.

## 2017-09-20 ENCOUNTER — Encounter: Payer: Self-pay | Admitting: Adult Health

## 2017-09-20 ENCOUNTER — Ambulatory Visit (INDEPENDENT_AMBULATORY_CARE_PROVIDER_SITE_OTHER): Payer: BLUE CROSS/BLUE SHIELD | Admitting: Adult Health

## 2017-09-20 VITALS — BP 130/80 | HR 104 | Resp 18 | Ht 64.0 in | Wt 178.0 lb

## 2017-09-20 DIAGNOSIS — Z1211 Encounter for screening for malignant neoplasm of colon: Secondary | ICD-10-CM

## 2017-09-20 DIAGNOSIS — Z1212 Encounter for screening for malignant neoplasm of rectum: Secondary | ICD-10-CM

## 2017-09-20 DIAGNOSIS — Z01411 Encounter for gynecological examination (general) (routine) with abnormal findings: Secondary | ICD-10-CM

## 2017-09-20 DIAGNOSIS — Z01419 Encounter for gynecological examination (general) (routine) without abnormal findings: Secondary | ICD-10-CM | POA: Insufficient documentation

## 2017-09-20 DIAGNOSIS — N946 Dysmenorrhea, unspecified: Secondary | ICD-10-CM

## 2017-09-20 LAB — HEMOCCULT GUIAC POC 1CARD (OFFICE): FECAL OCCULT BLD: NEGATIVE

## 2017-09-20 MED ORDER — KETOROLAC TROMETHAMINE 10 MG PO TABS: 10.0000 mg | ORAL_TABLET | Freq: Four times a day (QID) | ORAL | 0 refills | Status: DC | PRN

## 2017-09-20 NOTE — Progress Notes (Signed)
Patient ID: LETRICIA Paul, female   DOB: 1976-05-30, 41 y.o.   MRN: [id number] History of Present Illness: Stephanie Paul is a 41 year old white female, married in for well woman gyn exam, she had normal pap with negative HPV 05/27/16.  PCP is Dr Hilma Favors.    Current Medications, Allergies, Past Medical History, Past Surgical History, Family History and Social History were reviewed in [employer] record.     Review of Systems:  Patient denies any headaches, hearing loss, fatigue, blurred vision, shortness of breath, chest pain, abdominal pain, problems with bowel movements, urination, or intercourse. No joint pain or mood swings.Has bad period pain, esp first day, may even stay in bed that day.She has tried naprosyn without relief, and does not really want OCs.   Physical Exam:BP 130/80 (BP Location: Left Arm, Patient Position: Sitting, Cuff Size: Normal)   Pulse (!) 104   Resp 18   Ht 5\' 4"  (1.626 m)   Wt 178 lb (80.7 kg)   LMP 08/25/2017   SpO2 98%   BMI 30.55 kg/m  General:  Well developed, well nourished, no acute distress Skin:  Warm and dry Neck:  Midline trachea, normal thyroid, good ROM, no lymphadenopathy Lungs; Clear to auscultation bilaterally Breast:  No dominant palpable mass, retraction, or nipple discharge Cardiovascular: Regular rate and rhythm Abdomen:  Soft, non tender, no hepatosplenomegaly Pelvic:  External genitalia is normal in appearance, no lesions.  The vagina is normal in appearance. Urethra has no lesions or masses. The cervix is bulbous.  Uterus is felt to be normal size, shape, and contour.  No adnexal masses or tenderness noted.Bladder is non tender, no masses felt.On recatl has good tone, no masses felt and hemoccult was negative.  Extremities/musculoskeletal:  No swelling or varicosities noted, no clubbing or cyanosis Psych:  No mood changes, alert and cooperative,seems happy PHQ 2 score 0.Discussed trying toradol and also ablation.    Impression:  1. Encounter for well woman exam with routine gynecological exam   2. Dysmenorrhea   3. Screening for colorectal cancer      Plan: Physical in 1 year Pap in 2020 Mammogram yearly Labs with PCP

## 2017-12-13 ENCOUNTER — Other Ambulatory Visit (HOSPITAL_COMMUNITY): Payer: Self-pay | Admitting: Pulmonary Disease

## 2017-12-13 ENCOUNTER — Ambulatory Visit (HOSPITAL_COMMUNITY)
Admission: RE | Admit: 2017-12-13 | Discharge: 2017-12-13 | Disposition: A | Discharge status: Home / Self Care | Payer: BLUE CROSS/BLUE SHIELD | Source: Ambulatory Visit | Attending: Pulmonary Disease | Admitting: Pulmonary Disease

## 2017-12-13 DIAGNOSIS — J4541 Moderate persistent asthma with (acute) exacerbation: Secondary | ICD-10-CM

## 2018-01-12 ENCOUNTER — Other Ambulatory Visit: Payer: Self-pay | Admitting: Adult Health

## 2018-01-12 DIAGNOSIS — Z1231 Encounter for screening mammogram for malignant neoplasm of breast: Secondary | ICD-10-CM

## 2018-01-17 ENCOUNTER — Encounter (HOSPITAL_COMMUNITY): Payer: Self-pay

## 2018-01-17 ENCOUNTER — Ambulatory Visit (HOSPITAL_COMMUNITY)
Admission: RE | Admit: 2018-01-17 | Discharge: 2018-01-17 | Disposition: A | Discharge status: Home / Self Care | Payer: BLUE CROSS/BLUE SHIELD | Source: Ambulatory Visit | Attending: Adult Health | Admitting: Adult Health

## 2018-01-17 DIAGNOSIS — Z1231 Encounter for screening mammogram for malignant neoplasm of breast: Secondary | ICD-10-CM | POA: Diagnosis not present

## 2018-03-02 ENCOUNTER — Telehealth: Payer: Self-pay | Admitting: Adult Health

## 2018-03-02 NOTE — Telephone Encounter (Signed)
I called patient back and she states that she just started her period for the 3rd time in about 6 weeks. She is having a lot of cramps and pain. She would like to be seen before 4/1. Advised patient that I will send a message to [facility] and see what we can do for her. Patient agreeable.

## 2018-03-02 NOTE — Telephone Encounter (Signed)
Patient called stating that she has an appointment with Anderson Malta on 03/14/2018, but she states that there is something bad going on and she needs to come in sooner. I let patient know we do not have anything with Anderson Malta for today or this week. Please contact pt

## 2018-03-02 NOTE — Telephone Encounter (Signed)
Pt having frequent heavy periods to come in Friday at 8:30 am with Dr Glo Herring

## 2018-03-04 ENCOUNTER — Encounter: Payer: Self-pay | Admitting: Obstetrics and Gynecology

## 2018-03-04 ENCOUNTER — Ambulatory Visit (INDEPENDENT_AMBULATORY_CARE_PROVIDER_SITE_OTHER): Payer: BLUE CROSS/BLUE SHIELD | Admitting: Obstetrics and Gynecology

## 2018-03-04 VITALS — BP 130/90 | HR 98 | Ht 63.4 in | Wt 177.0 lb

## 2018-03-04 DIAGNOSIS — N939 Abnormal uterine and vaginal bleeding, unspecified: Secondary | ICD-10-CM | POA: Diagnosis not present

## 2018-03-04 NOTE — Progress Notes (Signed)
[facility] Clinic Visit  @DATE @            Patient name: Stephanie Paul MRN [id number]  Date of birth: [birth year]  CC & HPI:  Stephanie Paul is a 42 y.o. female presenting today for abnormal uterine bleeding.  She has had for "periods" in the last 2 months and is "tired of it".  Current contraception is withdrawal.  Patient interested in considering endometrial ablation.  And has discussed it with other providers and female friends.  Patient fairly familiar with the process of ablation  ROS:  ROS Light bleeding began 3 days ago.  Minimal bleeding today   Pertinent History Reviewed:   Reviewed: Significant for history of ovarian cysts 2015 ultrasound done at that time unremarkable uterus Medical         Past Medical History:  Diagnosis Date  . Anal fissure 05/27/2016  . Asthma   . Contraceptive management 01/30/2015  . Hemorrhoids 05/27/2016  . Left ovarian cyst 09/21/2014  . Menstrual cramps 09/21/2014  . Ovarian cyst, right                               Surgical Hx:   History reviewed. No pertinent surgical history. Medications: Reviewed & Updated - see associated section                       Current Outpatient Medications:  .  albuterol (PROVENTIL HFA;VENTOLIN HFA) 108 (90 BASE) MCG/ACT inhaler, Inhale 2 puffs into the lungs every 6 (six) hours as needed for wheezing or shortness of breath., Disp: , Rfl:  .  ketorolac (TORADOL) 10 MG tablet, Take 1 tablet (10 mg total) by mouth every 6 (six) hours as needed., Disp: 20 tablet, Rfl: 0 .  Multiple Vitamin (MULTIVITAMIN) tablet, Take 1 tablet by mouth daily., Disp: , Rfl:  .  beclomethasone (QVAR) 40 MCG/ACT inhaler, Inhale 1 puff into the lungs as needed., Disp: , Rfl:  .  hydrocortisone-pramoxine (ANALPRAM HC) 2.5-1 % rectal cream, Place 1 application rectally 3 (three) times daily. (Patient not taking: Reported on 09/20/2017), Disp: 30 g, Rfl: 3   Social History: Reviewed -  reports that she has quit smoking. Her smoking  use included cigarettes. She quit after 10.00 years of use. She has never used smokeless tobacco.  Objective Findings:  Vitals: Blood pressure 130/90, pulse 98, height 5' 3.4" (1.61 m), weight 177 lb (80.3 kg).  PHYSICAL EXAMINATION General appearance - alert, well appearing, and in no distress, oriented to person, place, and time and normal appearing weight Mental status - alert, oriented to person, place, and time, normal mood, behavior, speech, dress, motor activity, and thought processes Chest -  Heart - normal rate and regular rhythm Abdomen - soft, nontender, nondistended, no masses or organomegaly Breasts -  Skin - normal coloration and turgor, no rashes, no suspicious skin lesions noted  PELVIC External genitalia -normal Vulva -normal Vagina -minimal blood in the vaginal secretions Cervix -clear cervical mucus at office  Uterus -nontender well supported anteverted seemingly within normal limits  Adnexa -nontender [place] -not required Rectal - rectal exam not indicated    Assessment & Plan:   A:  1. Abnormal uterine bleeding (AUB)  P:  1. Transvaginal ultrasound within 1 week to discuss results at time of appointment 2.  We will discuss possible salpingectomy as contraception along with other options at the time of  the review of ultrasound

## 2018-03-09 ENCOUNTER — Other Ambulatory Visit: Payer: Self-pay | Admitting: Obstetrics and Gynecology

## 2018-03-09 DIAGNOSIS — N939 Abnormal uterine and vaginal bleeding, unspecified: Secondary | ICD-10-CM

## 2018-03-10 ENCOUNTER — Ambulatory Visit (INDEPENDENT_AMBULATORY_CARE_PROVIDER_SITE_OTHER): Payer: BLUE CROSS/BLUE SHIELD

## 2018-03-10 DIAGNOSIS — N83202 Unspecified ovarian cyst, left side: Secondary | ICD-10-CM

## 2018-03-10 DIAGNOSIS — N939 Abnormal uterine and vaginal bleeding, unspecified: Secondary | ICD-10-CM | POA: Diagnosis not present

## 2018-03-10 DIAGNOSIS — R1031 Right lower quadrant pain: Secondary | ICD-10-CM | POA: Diagnosis not present

## 2018-03-10 NOTE — Progress Notes (Signed)
PELVIC US TA/TV: homogeneous anteverted uterus,wnl,EEC 9.6 cm,normal right ovary,simple left ovarian cyst 3 x 2.8 x 1.3 cm,ovaries appear mobile,right adnexal discomfort during ultrasound,no free fluid

## 2018-03-14 ENCOUNTER — Ambulatory Visit (INDEPENDENT_AMBULATORY_CARE_PROVIDER_SITE_OTHER): Payer: BLUE CROSS/BLUE SHIELD | Admitting: Adult Health

## 2018-03-14 ENCOUNTER — Other Ambulatory Visit: Payer: Self-pay

## 2018-03-14 ENCOUNTER — Encounter: Payer: Self-pay | Admitting: Adult Health

## 2018-03-14 VITALS — BP 134/88 | HR 99 | Ht 64.0 in | Wt 178.0 lb

## 2018-03-14 DIAGNOSIS — N921 Excessive and frequent menstruation with irregular cycle: Secondary | ICD-10-CM

## 2018-03-14 DIAGNOSIS — N946 Dysmenorrhea, unspecified: Secondary | ICD-10-CM | POA: Diagnosis not present

## 2018-03-14 NOTE — Progress Notes (Signed)
Subjective:     Patient ID: Stephanie Paul, female   DOB: 03-Mar-1976, 42 y.o.   MRN: [id number]  HPI Stephanie Paul is a 42 year old white female, married in to discuss her periods and [place] she had in march after seeing Dr Glo Herring for AUB.   Review of Systems +heavy periods +pain with periods Sometimes 2 periods a month Pain in side at times Reviewed past medical,surgical, social and family history. Reviewed medications and allergies.     Objective:   Physical Exam BP 134/88 (BP Location: Right Arm, Patient Position: Sitting, Cuff Size: Normal)   Pulse 99   Ht 5\' 4"  (1.626 m)   Wt 178 lb (80.7 kg)   LMP 02/26/2018 (Exact Date)   BMI 30.55 kg/m Talk only;US was normal, has simple cyst.Discussed ablation and tubal,pros and cons, and she wants to proceed with that, face time 10 minutes counseling.    Assessment:     1. Menorrhagia with irregular cycle   2. Dysmenorrhea       Plan:     Review handouts on ablation and tubal ligation Return in 1 week for pre opp with Dr Glo Herring

## 2018-03-24 ENCOUNTER — Encounter: Payer: BLUE CROSS/BLUE SHIELD | Admitting: Obstetrics and Gynecology

## 2018-03-25 ENCOUNTER — Ambulatory Visit (INDEPENDENT_AMBULATORY_CARE_PROVIDER_SITE_OTHER): Payer: BLUE CROSS/BLUE SHIELD | Admitting: Obstetrics and Gynecology

## 2018-03-25 ENCOUNTER — Other Ambulatory Visit: Payer: Self-pay

## 2018-03-25 ENCOUNTER — Encounter: Payer: Self-pay | Admitting: Obstetrics and Gynecology

## 2018-03-25 ENCOUNTER — Telehealth: Payer: Self-pay | Admitting: Obstetrics and Gynecology

## 2018-03-25 VITALS — BP 164/108 | HR 97 | Ht 63.0 in | Wt 180.0 lb

## 2018-03-25 DIAGNOSIS — N920 Excessive and frequent menstruation with regular cycle: Secondary | ICD-10-CM | POA: Diagnosis not present

## 2018-03-25 DIAGNOSIS — I10 Essential (primary) hypertension: Secondary | ICD-10-CM | POA: Diagnosis not present

## 2018-03-25 DIAGNOSIS — Z113 Encounter for screening for infections with a predominantly sexual mode of transmission: Secondary | ICD-10-CM | POA: Diagnosis not present

## 2018-03-25 DIAGNOSIS — Z302 Encounter for sterilization: Secondary | ICD-10-CM | POA: Diagnosis not present

## 2018-03-25 MED ORDER — MEDROXYPROGESTERONE ACETATE 10 MG PO TABS: 10.0000 mg | ORAL_TABLET | Freq: Every day | ORAL | 0 refills | Status: DC

## 2018-03-25 MED ORDER — AMLODIPINE BESYLATE 5 MG PO TABS: 5.0000 mg | ORAL_TABLET | Freq: Every day | ORAL | 3 refills | Status: DC

## 2018-03-25 NOTE — Progress Notes (Addendum)
Preoperative History and Physical  Stephanie Paul is a 42 y.o. G1P1 here for surgical management of AUB. No significant preoperative concerns. She denies any complaints at this time.   Proposed surgery: bilateral salpingectomy and endometrial ablation  Past Medical History:  Diagnosis Date  . Anal fissure 05/27/2016  . Asthma   . Contraceptive management 01/30/2015  . Hemorrhoids 05/27/2016  . Left ovarian cyst 09/21/2014  . Menstrual cramps 09/21/2014  . Ovarian cyst, right    History reviewed. No pertinent surgical history. OB History  Gravida Para Term Preterm AB Living  1 1       1   SAB TAB Ectopic Multiple Live Births          1    # Outcome Date GA Lbr Len/2nd Weight Sex Delivery Anes PTL Lv  1 Para 06/27/09    F Vag-Spont   LIV  Patient denies any other pertinent gynecologic issues.   Current Outpatient Medications on File Prior to Visit  Medication Sig Dispense Refill  . albuterol (PROVENTIL HFA;VENTOLIN HFA) 108 (90 BASE) MCG/ACT inhaler Inhale 2 puffs into the lungs every 6 (six) hours as needed for wheezing or shortness of breath.    . beclomethasone (QVAR) 40 MCG/ACT inhaler Inhale 1 puff into the lungs as needed.    . hydrocortisone-pramoxine (ANALPRAM HC) 2.5-1 % rectal cream Place 1 application rectally 3 (three) times daily. 30 g 3  . Multiple Vitamin (MULTIVITAMIN) tablet Take 1 tablet by mouth daily.    Marland Kitchen ketorolac (TORADOL) 10 MG tablet Take 1 tablet (10 mg total) by mouth every 6 (six) hours as needed. (Patient not taking: Reported on 03/14/2018) 20 tablet 0   No current facility-administered medications on file prior to visit.    Allergies  Allergen Reactions  . Penicillins Hives and Rash    Social History:   reports that she has quit smoking. Her smoking use included cigarettes. She quit after 10.00 years of use. She has never used smokeless tobacco. She reports that she drinks about 1.2 oz of alcohol per week. She reports that she does not use  drugs.  Family History  Problem Relation Age of Onset  . Cancer Paternal Grandfather        leukemia  . Hyperlipidemia Paternal Grandmother   . Hypertension Paternal Grandmother   . Diabetes Maternal Grandfather     Review of Systems: Noncontributory  PHYSICAL EXAM: Blood pressure (!) 164/108, pulse 97, height 5\' 3"  (1.6 m), weight 180 lb (81.6 kg), last menstrual period 03/21/2018. General appearance - alert, well appearing, and in no distress Chest - clear to auscultation, no wheezes, rales or rhonchi, symmetric air entry Heart - normal rate and regular rhythm Abdomen - soft, nontender, nondistended, no masses or organomegaly Pelvic - normal external genitalia VAGINA: normal menstrual flow CERVIX: multiparous os UTERUS: anterior. Normal size, shape, and contour ADNEXA: normal adnexa in size, nontender and no masses. Extremities - peripheral pulses normal, no pedal edema, no clubbing or cyanosis  Labs: No results found for this or any previous visit (from the past 336 hour(s)).  Imaging Studies: US Pelvis Transvanginal Non-ob (tv Only)  Result Date: 03/13/2018 GYNECOLOGIC SONOGRAM Stephanie Paul is a 7 y.o. G1P1 LMP 02/26/2018,she is here for a pelvic sonogram for RLQ pain w/abnormal uterine bleeding,, "four periods" in the last 2 months Uterus                      8.5 x 4.4 x 5.3 cm,  vol 103 ml, homogeneous anteverted uterus,wnl Endometrium          9.6 mm, symmetrical, wnl Right ovary             2.9 x 2.6 x 2.5 cm, wnl Left ovary                3.9 x 1.8 x 2.7 cm, simple left ovarian cyst 3 x 2.8 x 1.3 cm No free fluid Technician Comments: PELVIC US TA/TV: homogeneous anteverted uterus,wnl,EEC 9.6 cm,normal right ovary,simple left ovarian cyst 3 x 2.8 x 1.3 cm,ovaries appear mobile,right adnexal discomfort during ultrasound,no free fluid [employer] 03/10/2018 11:37 AM Clinical Impression and recommendations: I have reviewed the sonogram results above. Combined with the  patient's current clinical course, below are my impressions and any appropriate recommendations for management based on the sonographic findings: 1. Normal appearing anteflexed uterus of normal size with symmetrical bilaminar endometrial tissues, proliferative phase suspected 2.  Normal adnexal structures, simple left ovarian cyst Jonnie Kind   US Pelvis Complete  Result Date: 03/13/2018 GYNECOLOGIC SONOGRAM Stephanie Paul is a 27 y.o. G1P1 LMP 02/26/2018,she is here for a pelvic sonogram for RLQ pain w/abnormal uterine bleeding,, "four periods" in the last 2 months Uterus                      8.5 x 4.4 x 5.3 cm, vol 103 ml, homogeneous anteverted uterus,wnl Endometrium          9.6 mm, symmetrical, wnl Right ovary             2.9 x 2.6 x 2.5 cm, wnl Left ovary                3.9 x 1.8 x 2.7 cm, simple left ovarian cyst 3 x 2.8 x 1.3 cm No free fluid Technician Comments: PELVIC US TA/TV: homogeneous anteverted uterus,wnl,EEC 9.6 cm,normal right ovary,simple left ovarian cyst 3 x 2.8 x 1.3 cm,ovaries appear mobile,right adnexal discomfort during ultrasound,no free fluid [employer] 03/10/2018 11:37 AM Clinical Impression and recommendations: I have reviewed the sonogram results above. Combined with the patient's current clinical course, below are my impressions and any appropriate recommendations for management based on the sonographic findings: 1. Normal appearing anteflexed uterus of normal size with symmetrical bilaminar endometrial tissues, proliferative phase suspected 2.  Normal adnexal structures, simple left ovarian cyst Jonnie Kind    Assessment: menorrhagia                        Desire for sterilization                       Hypertension, (anxiety vs CHTN) Patient Active Problem List   Diagnosis Date Noted  . Encounter for well woman exam with routine gynecological exam 09/20/2017  . Dysmenorrhea 09/20/2017  . Hemorrhoids 05/27/2016  . Anal fissure 05/27/2016  . Contraceptive  management 01/30/2015  . Left ovarian cyst 09/21/2014  . Menstrual cramps 09/21/2014  . GERD (gastroesophageal reflux disease) 04/05/2013  . right ovarian cyst 03/28/2013    Plan: Patient will undergo surgical management with endometrial ablation and bilateral salpingectomy.  Begun on Norvasc 5 qd  By signing my name below, I, Soijett Blue, attest that this documentation has been prepared under the direction and in the presence of Jonnie Kind, MD. Electronically Signed: Soijett Blue, ED Scribe. 03/25/18. 8:59 AM.  I personally performed the services  described in this documentation, which was SCRIBED in my presence. The recorded information has been reviewed and considered accurate. It has been edited as necessary during review. Jonnie Kind, MD

## 2018-03-25 NOTE — Addendum Note (Signed)
Addended by: Gaylyn Rong A on: 03/25/2018 09:43 AM   Modules accepted: Orders

## 2018-03-25 NOTE — Telephone Encounter (Signed)
Informed patient to yes take 14 days prior to surgery. Verbalized understanding.

## 2018-03-28 LAB — GC/CHLAMYDIA PROBE AMP
Chlamydia trachomatis, NAA: NEGATIVE
Neisseria gonorrhoeae by PCR: NEGATIVE

## 2018-04-04 NOTE — Patient Instructions (Signed)
Stephanie Paul  04/04/2018     @PREFPERIOPPHARMACY @   Your procedure is scheduled on 4/30/20189.  Report to [name] at 8:30 A.M.  Call this number if you have problems the morning of surgery:  [phone]   Remember:  Do not eat food or drink liquids after midnight.  Take these medicines the morning of surgery with A SIP OF WATER Albuterol inhaler, Amlodipine, Toradol if needed, Provera   Do not wear jewelry, make-up or nail polish.  Do not wear lotions, powders, or perfumes, or deodorant.  Do not shave 48 hours prior to surgery.  Men may shave face and neck.  Do not bring valuables to the hospital.  [facility] is not responsible for any belongings or valuables.  Contacts, dentures or bridgework may not be worn into surgery.  Leave your suitcase in the car.  After surgery it may be brought to your room.  For patients admitted to the hospital, discharge time will be determined by your treatment team.  Patients discharged the day of surgery will not be allowed to drive home.    Please read over the following fact sheets that you were given. Surgical Site Infection Prevention and Anesthesia Post-op Instructions     PATIENT INSTRUCTIONS POST-ANESTHESIA  IMMEDIATELY FOLLOWING SURGERY:  Do not drive or operate machinery for the first twenty four hours after surgery.  Do not make any important decisions for twenty four hours after surgery or while taking narcotic pain medications or sedatives.  If you develop intractable nausea and vomiting or a severe headache please notify your doctor immediately.  FOLLOW-UP:  Please make an appointment with your surgeon as instructed. You do not need to follow up with anesthesia unless specifically instructed to do so.  WOUND CARE INSTRUCTIONS (if applicable):  Keep a dry clean dressing on the anesthesia/puncture wound site if there is drainage.  Once the wound has quit draining you may leave it open to air.  Generally you should leave  the bandage intact for twenty four hours unless there is drainage.  If the epidural site drains for more than 36-48 hours please call the anesthesia department.  QUESTIONS?:  Please feel free to call your physician or the hospital operator if you have any questions, and they will be happy to assist you.      Salpingectomy Salpingectomy, also called tubectomy, is the surgical removal of one of the fallopian tubes. The fallopian tubes are where eggs travel from the ovaries to the uterus. Removing one fallopian tube does not prevent you from becoming pregnant. It also does not cause problems with your menstrual periods. You may need a salpingectomy if you:  Have a fertilized egg that attaches to the fallopian tube (ectopic pregnancy), especially one that causes the tube to burst or tear (rupture).  Have an infected fallopian tube.  Have cancer of the fallopian tube or nearby organs.  Have had an ovary removed due to a cyst or tumor.  Have had your uterus removed.  There are three different methods that can be used for a salpingectomy:  Open. This method involves making one large incision in your abdomen.  Laparoscopic. This method involves using a thin, lighted tube with a tiny camera on the end (laparoscope) to help perform the procedure. The laparoscope will allow your surgeon to make several small incisions in the abdomen instead of a large incision.  Robot-assisted: This method involves using a computer to control surgical instruments that are attached to robotic arms.  Tell a health care provider about:  Any allergies you have.  All medicines you are taking, including vitamins, herbs, eye drops, creams, and over-the-counter medicines.  Any problems you or family members have had with anesthetic medicines.  Any blood disorders you have.  Any surgeries you have had.  Any medical conditions you have.  Whether you are pregnant or may be pregnant. What are the risks? Generally,  this is a safe procedure. However, problems may occur, including:  Infection.  Bleeding.  Allergic reactions to medicines.  Damage to other structures or organs.  Blood clots in the legs or lungs.  What happens before the procedure? Staying hydrated Follow instructions from your health care provider about hydration, which may include:  Up to 2 hours before the procedure - you may continue to drink clear liquids, such as water, clear fruit juice, black coffee, and plain tea.  Eating and drinking restrictions Follow instructions from your health care provider about eating and drinking, which may include:  8 hours before the procedure - stop eating heavy meals or foods such as meat, fried foods, or fatty foods.  6 hours before the procedure - stop eating light meals or foods, such as toast or cereal.  6 hours before the procedure - stop drinking milk or drinks that contain milk.  2 hours before the procedure - stop drinking clear liquids.  Medicines  Ask your health care provider about: ? Changing or stopping your regular medicines. This is especially important if you are taking diabetes medicines or blood thinners. ? Taking medicines such as aspirin and ibuprofen. These medicines can thin your blood. Do not take these medicines before your procedure if your health care provider instructs you not to.  You may be given antibiotic medicine to help prevent infection. General instructions  Do not smoke for at least 2 weeks before your procedure. If you need help quitting, ask your health care provider.  You may have an exam or tests, such as an electrocardiogram (ECG).  You may have a blood or urine sample taken.  Ask your health care provider: ? Whether you should stop removing hair from your surgical area. ? How your surgical site will be marked or identified.  You may be asked to shower with a germ-killing soap.  Plan to have someone take you home from the hospital or  clinic.  If you will be going home right after the procedure, plan to have someone with you for 24 hours. What happens during the procedure?  To reduce your risk of infection: ? Your health care team will wash or sanitize their hands. ? Hair may be removed from the surgical area. ? Your skin will be washed with soap.  An IV tube will be inserted into one of your veins.  You will be given a medicine to make you fall asleep (general anesthetic). You may also be given a medicine to help you relax (sedative).  A thin tube (catheter) may be inserted through your urethra and into your bladder to drain urine during your procedure.  Depending on the type of procedure you are having, one incision or several small incisions will be made in your abdomen.  Your fallopian tube will be cut and removed from where it attaches to your uterus.  Your blood vessels will be clamped and tied to prevent excess bleeding.  The incision(s) in your abdomen will be closed with stitches (sutures), staples, or skin glue.  A bandage (dressing) may be placed over  your incision(s). The procedure may vary among health care providers and hospitals. What happens after the procedure?  Your blood pressure, heart rate, breathing rate, and blood oxygen level will be monitored until the medicines you were given have worn off.  You may continue to receive fluids and medicines through an IV tube.  You may continue to have a catheter draining your urine.  You may have to wear compression stockings. These stockings help to prevent blood clots and reduce swelling in your legs.  You will be given pain medicine as needed.  Do not drive for 24 hours if you received a sedative. Summary  Salpingectomy is a surgical procedure to remove one of the fallopian tubes.  The procedure may be done with an open incision, with a laparoscope, or with computer-controlled instruments.  Depending on the type of procedure you are having,  one incision or several small incisions will be made in your abdomen.  Your blood pressure, heart rate, breathing rate, and blood oxygen level will be monitored until the medicines you were given have worn off.  Plan to have someone take you home from the hospital or clinic. This information is not intended to replace advice given to you by your health care provider. Make sure you discuss any questions you have with your health care provider. Document Released: 04/18/2009 Document Revised: 07/17/2016 Document Reviewed: 05/24/2013 Elsevier Interactive Patient Education  2018 [employer]. Cervical Laser Surgery Cervical laser surgery is a procedure that uses a focused beam of light (laser) to shrink, destroy, or remove tissue from the opening of the uterus (cervix). You may have this surgery if:  You have abnormal cells (dysplasia) in your cervix that are an early sign of cancer.  Tissue from your cervix is to be tested for signs of disease.  Tell a health care provider about:  Any allergies you have.  All medicines you are taking, including vitamins, herbs, eye drops, creams, and over-the-counter medicines.  Any problems you or family members have had with anesthetic medicines.  Any blood disorders you have.  Any surgeries you have had.  Any medical conditions you have.  Whether you are pregnant or may be pregnant. What are the risks? Generally, this is a safe procedure. However, problems may occur, including:  Bleeding.  Infection.  Allergic reactions to medicines or dyes.  Damage to other structures or organs.  Narrowing of the cervix (cervical stenosis). This can lead to infertility, cervical incompetency, and miscarriage or preterm labor in a future pregnancy.  What happens before the procedure?  Ask your health care provider about changing or stopping your regular medicines. This is especially important if you are taking diabetes medicines or blood thinners.  For  at least 24 hours before the procedure: ? Do not have sex. ? Do not use tampons. ? Do not douche. ? Do not use vaginal creams or medicines. What happens during the procedure?  To lower your risk of infection, your health care team will wash or sanitize their hands.  You will lie on your back with your feet raised in footrests (stirrups).  A device called a speculum will be put into your vagina to hold it open.  You will be given a medicine to numb the area (local anesthetic). You may feel cramping when the medicine is injected.  A magnifying instrument called a colposcope will be placed into your vagina. It will shine a light and allow your health care provider to see your vagina and cervix.  A solution will be swabbed on your cervix to help your health care provider see the tissue.  A thin, flexible tube called an endoscope will be inserted into your vagina. The laser will be on the end of the endoscope.  The laser will be used to shrink, destroy, or remove the tissue.  A cotton swab soaked in solution may be used to remove any burned or destroyed tissue.  A paste may be applied to your cervix to stop bleeding. The procedure may vary among health care providers and hospitals. What happens after the procedure?  You may feel some pain or cramping for a few days.  You may have some bleeding and brownish discharge.  Wear sanitary pads for discharge.  Do not have sex or put anything in your vagina until your health care provider says it is okay.  Your health care provider may recommend that you limit your physical activity for a few days. Ask your health care provider what activities are safe for you. Summary  Cervical laser surgery is a procedure that uses a focused beam of light (laser) to shrink, destroy, or remove tissue from the cervix.  You may have this procedure if you have abnormal cells in your cervix that are an early sign of cancer, or if tissue from your cervix is  to be tested for signs of disease.  Generally, this is a safe procedure. However, problems may occur.  Do not have sex, use tampons, douche, or use vaginal creams or medicines for at least 24 hours before the procedure.  You may have pain, cramping, bleeding, and discharge for a few days after the procedure. This information is not intended to replace advice given to you by your health care provider. Make sure you discuss any questions you have with your health care provider. Document Released: 10/19/2016 Document Revised: 10/19/2016 Document Reviewed: 10/19/2016 Elsevier Interactive Patient Education  2018 [employer]. Dilation and Curettage or Vacuum Curettage Dilation and curettage (D&C) and vacuum curettage are minor procedures. A D&C involves stretching (dilation) the cervix and scraping (curettage) the inside lining of the uterus (endometrium). During a D&C, tissue is gently scraped from the endometrium, starting from the top portion of the uterus down to the lowest part of the uterus (cervix). During a vacuum curettage, the lining and tissue in the uterus are removed with the use of gentle suction. Curettage may be performed to either diagnose or treat a problem. As a diagnostic procedure, curettage is performed to examine tissues from the uterus. A diagnostic curettage may be done if you have:  Irregular bleeding in the uterus.  Bleeding with the development of clots.  Spotting between menstrual periods.  Prolonged menstrual periods or other abnormal bleeding.  Bleeding after menopause.  No menstrual period (amenorrhea).  A change in size and shape of the uterus.  Abnormal endometrial cells discovered during a Pap test.  As a treatment procedure, curettage may be performed for the following reasons:  Removal of an IUD (intrauterine device).  Removal of retained placenta after giving birth.  Abortion.  Miscarriage.  Removal of endometrial polyps.  Removal of  uncommon types of noncancerous lumps (fibroids).  Tell a health care provider about:  Any allergies you have, including allergies to prescribed medicine or latex.  All medicines you are taking, including vitamins, herbs, eye drops, creams, and over-the-counter medicines. This is especially important if you take any blood-thinning medicine. Bring a list of all of your medicines to your appointment.  Any problems  you or family members have had with anesthetic medicines.  Any blood disorders you have.  Any surgeries you have had.  Your medical history and any medical conditions you have.  Whether you are pregnant or may be pregnant.  Recent vaginal infections you have had.  Recent menstrual periods, bleeding problems you have had, and what form of birth control (contraception) you use. What are the risks? Generally, this is a safe procedure. However, problems may occur, including:  Infection.  Heavy vaginal bleeding.  Allergic reactions to medicines.  Damage to the cervix or other structures or organs.  Development of scar tissue (adhesions) inside the uterus, which can cause abnormal amounts of menstrual bleeding. This may make it harder to get pregnant in the future.  A hole (perforation) or puncture in the uterine wall. This is rare.  What happens before the procedure? Staying hydrated Follow instructions from your health care provider about hydration, which may include:  Up to 2 hours before the procedure - you may continue to drink clear liquids, such as water, clear fruit juice, black coffee, and plain tea.  Eating and drinking restrictions Follow instructions from your health care provider about eating and drinking, which may include:  8 hours before the procedure - stop eating heavy meals or foods such as meat, fried foods, or fatty foods.  6 hours before the procedure - stop eating light meals or foods, such as toast or cereal.  6 hours before the procedure -  stop drinking milk or drinks that contain milk.  2 hours before the procedure - stop drinking clear liquids. If your health care provider told you to take your medicine(s) on the day of your procedure, take them with only a sip of water.  Medicines  Ask your health care provider about: ? Changing or stopping your regular medicines. This is especially important if you are taking diabetes medicines or blood thinners. ? Taking medicines such as aspirin and ibuprofen. These medicines can thin your blood. Do not take these medicines before your procedure if your health care provider instructs you not to.  You may be given antibiotic medicine to help prevent infection. General instructions  For 24 hours before your procedure, do not: ? Douche. ? Use tampons. ? Use medicines, creams, or suppositories in the vagina. ? Have sexual intercourse.  You may be given a pregnancy test on the day of the procedure.  Plan to have someone take you home from the hospital or clinic.  You may have a blood or urine sample taken.  If you will be going home right after the procedure, plan to have someone with you for 24 hours. What happens during the procedure?  To reduce your risk of infection: ? Your health care team will wash or sanitize their hands. ? Your skin will be washed with soap.  An IV tube will be inserted into one of your veins.  You will be given one of the following: ? A medicine that numbs the area in and around the cervix (local anesthetic). ? A medicine to make you fall asleep (general anesthetic).  You will lie down on your back, with your feet in foot rests (stirrups).  The size and position of your uterus will be checked.  A lubricated instrument (speculum or Sims retractor) will be inserted into the back side of your vagina. The speculum will be used to hold apart the walls of your vagina so your health care provider can see your cervix.  A  tool (tenaculum) will be attached  to the lip of the cervix to stabilize it.  Your cervix will be softened and dilated. This may be done by: ? Taking a medicine. ? Having tapered dilators or thin rods (laminaria) or gradual widening instruments (tapered dilators) inserted into your cervix.  A small, sharp, curved instrument (curette) will be used to scrape a small amount of tissue or cells from the endometrium or cervical canal. In some cases, gentle suction is applied with the curette. The curette will then be removed. The cells will be taken to a lab for testing. The procedure may vary among health care providers and hospitals. What happens after the procedure?  You may have mild cramping, backache, pain, and light bleeding or spotting. You may pass small blood clots from your vagina.  You may have to wear compression stockings. These stockings help to prevent blood clots and reduce swelling in your legs.  Your blood pressure, heart rate, breathing rate, and blood oxygen level will be monitored until the medicines you were given have worn off. Summary  Dilation and curettage (D&C) involves stretching (dilation) the cervix and scraping (curettage) the inside lining of the uterus (endometrium).  After the procedure, you may have mild cramping, backache, pain, and light bleeding or spotting. You may pass small blood clots from your vagina.  Plan to have someone take you home from the hospital or clinic. This information is not intended to replace advice given to you by your health care provider. Make sure you discuss any questions you have with your health care provider. Document Released: 11/30/2005 Document Revised: 08/16/2016 Document Reviewed: 08/16/2016 Elsevier Interactive Patient Education  2018 [employer].

## 2018-04-05 ENCOUNTER — Other Ambulatory Visit: Payer: Self-pay | Admitting: Obstetrics and Gynecology

## 2018-04-06 ENCOUNTER — Other Ambulatory Visit: Payer: Self-pay

## 2018-04-06 ENCOUNTER — Encounter (HOSPITAL_COMMUNITY)
Admission: RE | Admit: 2018-04-06 | Discharge: 2018-04-06 | Disposition: A | Discharge status: Home / Self Care | Payer: BLUE CROSS/BLUE SHIELD | Source: Ambulatory Visit | Attending: Obstetrics and Gynecology | Admitting: Obstetrics and Gynecology

## 2018-04-06 ENCOUNTER — Encounter (HOSPITAL_COMMUNITY): Payer: Self-pay

## 2018-04-06 DIAGNOSIS — R9431 Abnormal electrocardiogram [ECG] [EKG]: Secondary | ICD-10-CM | POA: Insufficient documentation

## 2018-04-06 DIAGNOSIS — Z01812 Encounter for preprocedural laboratory examination: Secondary | ICD-10-CM | POA: Insufficient documentation

## 2018-04-06 DIAGNOSIS — Z0183 Encounter for blood typing: Secondary | ICD-10-CM | POA: Diagnosis not present

## 2018-04-06 DIAGNOSIS — Z01818 Encounter for other preprocedural examination: Secondary | ICD-10-CM | POA: Insufficient documentation

## 2018-04-06 HISTORY — DX: Essential (primary) hypertension: I10

## 2018-04-06 LAB — HCG, SERUM, QUALITATIVE: PREG SERUM: NEGATIVE

## 2018-04-06 LAB — TYPE AND SCREEN
ABO/RH(D): O POS
Antibody Screen: NEGATIVE

## 2018-04-06 LAB — CBC
HCT: 35.9 % — ABNORMAL LOW (ref 36.0–46.0)
Hemoglobin: 12.2 g/dL (ref 12.0–15.0)
MCH: 33.2 pg (ref 26.0–34.0)
MCHC: 34 g/dL (ref 30.0–36.0)
MCV: 97.8 fL (ref 78.0–100.0)
PLATELETS: 228 10*3/uL (ref 150–400)
RBC: 3.67 MIL/uL — ABNORMAL LOW (ref 3.87–5.11)
RDW: 12.1 % (ref 11.5–15.5)
WBC: 8.9 10*3/uL (ref 4.0–10.5)

## 2018-04-06 LAB — COMPREHENSIVE METABOLIC PANEL
ALT: 19 U/L (ref 14–54)
ANION GAP: 8 (ref 5–15)
AST: 20 U/L (ref 15–41)
Albumin: 4 g/dL (ref 3.5–5.0)
Alkaline Phosphatase: 51 U/L (ref 38–126)
BUN: 15 mg/dL (ref 6–20)
CHLORIDE: 107 mmol/L (ref 101–111)
CO2: 23 mmol/L (ref 22–32)
Calcium: 8.9 mg/dL (ref 8.9–10.3)
Creatinine, Ser: 0.72 mg/dL (ref 0.44–1.00)
Glucose, Bld: 100 mg/dL — ABNORMAL HIGH (ref 65–99)
POTASSIUM: 3.6 mmol/L (ref 3.5–5.1)
Sodium: 138 mmol/L (ref 135–145)
Total Bilirubin: 1 mg/dL (ref 0.3–1.2)
Total Protein: 7 g/dL (ref 6.5–8.1)

## 2018-04-12 ENCOUNTER — Encounter (HOSPITAL_COMMUNITY)
Admission: RE | Disposition: A | Discharge status: Home / Self Care | Payer: Self-pay | Source: Ambulatory Visit | Attending: Obstetrics and Gynecology

## 2018-04-12 ENCOUNTER — Ambulatory Visit (HOSPITAL_COMMUNITY)
Admission: RE | Admit: 2018-04-12 | Discharge: 2018-04-12 | Disposition: A | Discharge status: Home / Self Care | Payer: BLUE CROSS/BLUE SHIELD | Source: Ambulatory Visit | Attending: Obstetrics and Gynecology | Admitting: Obstetrics and Gynecology

## 2018-04-12 ENCOUNTER — Encounter (HOSPITAL_COMMUNITY): Payer: Self-pay | Admitting: Anesthesiology

## 2018-04-12 ENCOUNTER — Ambulatory Visit (HOSPITAL_COMMUNITY): Payer: BLUE CROSS/BLUE SHIELD | Admitting: Anesthesiology

## 2018-04-12 DIAGNOSIS — Z87891 Personal history of nicotine dependence: Secondary | ICD-10-CM | POA: Diagnosis not present

## 2018-04-12 DIAGNOSIS — I10 Essential (primary) hypertension: Secondary | ICD-10-CM | POA: Diagnosis not present

## 2018-04-12 DIAGNOSIS — Z880 Allergy status to penicillin: Secondary | ICD-10-CM | POA: Diagnosis not present

## 2018-04-12 DIAGNOSIS — Z302 Encounter for sterilization: Secondary | ICD-10-CM | POA: Insufficient documentation

## 2018-04-12 DIAGNOSIS — J45909 Unspecified asthma, uncomplicated: Secondary | ICD-10-CM | POA: Diagnosis not present

## 2018-04-12 DIAGNOSIS — N920 Excessive and frequent menstruation with regular cycle: Secondary | ICD-10-CM | POA: Insufficient documentation

## 2018-04-12 DIAGNOSIS — Z79899 Other long term (current) drug therapy: Secondary | ICD-10-CM | POA: Diagnosis not present

## 2018-04-12 DIAGNOSIS — N838 Other noninflammatory disorders of ovary, fallopian tube and broad ligament: Secondary | ICD-10-CM | POA: Insufficient documentation

## 2018-04-12 HISTORY — PX: LAPAROSCOPIC BILATERAL SALPINGECTOMY: SHX5889

## 2018-04-12 HISTORY — PX: HYSTEROSCOPY WITH D & C: SHX1775

## 2018-04-12 HISTORY — PX: ENDOMETRIAL ABLATION: SHX621

## 2018-04-12 SURGERY — DILATATION AND CURETTAGE /HYSTEROSCOPY
Anesthesia: General | Site: Uterus

## 2018-04-12 MED ORDER — 0.9 % SODIUM CHLORIDE (POUR BTL) OPTIME
TOPICAL | Status: DC | PRN
  Administered 2018-04-12: 1000 mL

## 2018-04-12 MED ORDER — PROPOFOL 10 MG/ML IV BOLUS
INTRAVENOUS | Status: AC
  Filled 2018-04-12: qty 40

## 2018-04-12 MED ORDER — KETOROLAC TROMETHAMINE 10 MG PO TABS: 10.0000 mg | ORAL_TABLET | Freq: Four times a day (QID) | ORAL | 0 refills | Status: DC | PRN

## 2018-04-12 MED ORDER — PROPOFOL 10 MG/ML IV BOLUS
INTRAVENOUS | Status: DC | PRN
  Administered 2018-04-12: 150 mg via INTRAVENOUS

## 2018-04-12 MED ORDER — ONDANSETRON HCL 4 MG/2ML IJ SOLN
INTRAMUSCULAR | Status: DC | PRN
  Administered 2018-04-12: 4 mg via INTRAVENOUS

## 2018-04-12 MED ORDER — ROCURONIUM BROMIDE 50 MG/5ML IV SOLN
INTRAVENOUS | Status: AC
  Filled 2018-04-12: qty 1

## 2018-04-12 MED ORDER — DEXAMETHASONE SODIUM PHOSPHATE 4 MG/ML IJ SOLN
INTRAMUSCULAR | Status: DC | PRN
  Administered 2018-04-12: 4 mg via INTRAVENOUS

## 2018-04-12 MED ORDER — SODIUM CHLORIDE 0.9 % IJ SOLN
INTRAMUSCULAR | Status: AC
  Filled 2018-04-12: qty 10

## 2018-04-12 MED ORDER — HYDROMORPHONE HCL 1 MG/ML IJ SOLN
0.2500 mg | INTRAMUSCULAR | Status: DC | PRN
  Administered 2018-04-12 (×4): 0.5 mg via INTRAVENOUS
  Filled 2018-04-12 (×2): qty 0.5

## 2018-04-12 MED ORDER — MIDAZOLAM HCL 2 MG/2ML IJ SOLN
INTRAMUSCULAR | Status: AC
  Filled 2018-04-12: qty 2

## 2018-04-12 MED ORDER — LIDOCAINE HCL (PF) 1 % IJ SOLN
INTRAMUSCULAR | Status: AC
  Filled 2018-04-12: qty 5

## 2018-04-12 MED ORDER — MIDAZOLAM HCL 2 MG/2ML IJ SOLN: 0.5000 mg | Freq: Once | INTRAMUSCULAR | Status: DC | PRN

## 2018-04-12 MED ORDER — TRAMADOL HCL 50 MG PO TABS: 50.0000 mg | ORAL_TABLET | Freq: Four times a day (QID) | ORAL | 0 refills | Status: DC | PRN

## 2018-04-12 MED ORDER — CLINDAMYCIN PHOSPHATE 900 MG/50ML IV SOLN
900.0000 mg | INTRAVENOUS | Status: AC
  Administered 2018-04-12: 900 mg via INTRAVENOUS
  Filled 2018-04-12: qty 50

## 2018-04-12 MED ORDER — ROCURONIUM BROMIDE 100 MG/10ML IV SOLN
INTRAVENOUS | Status: DC | PRN
  Administered 2018-04-12: 40 mg via INTRAVENOUS

## 2018-04-12 MED ORDER — CIPROFLOXACIN IN D5W 400 MG/200ML IV SOLN
400.0000 mg | INTRAVENOUS | Status: AC
  Administered 2018-04-12: 400 mg via INTRAVENOUS
  Filled 2018-04-12: qty 200

## 2018-04-12 MED ORDER — FENTANYL CITRATE (PF) 100 MCG/2ML IJ SOLN
INTRAMUSCULAR | Status: DC | PRN
  Administered 2018-04-12 (×5): 50 ug via INTRAVENOUS

## 2018-04-12 MED ORDER — MIDAZOLAM HCL 5 MG/5ML IJ SOLN
INTRAMUSCULAR | Status: DC | PRN
  Administered 2018-04-12: 2 mg via INTRAVENOUS

## 2018-04-12 MED ORDER — SUCCINYLCHOLINE CHLORIDE 20 MG/ML IJ SOLN
INTRAMUSCULAR | Status: AC
Start: 2018-04-12 — End: ?
  Filled 2018-04-12: qty 1

## 2018-04-12 MED ORDER — SODIUM CHLORIDE 0.9 % IR SOLN
Status: DC | PRN
  Administered 2018-04-12: 3000 mL

## 2018-04-12 MED ORDER — LIDOCAINE HCL (CARDIAC) PF 100 MG/5ML IV SOSY
PREFILLED_SYRINGE | INTRAVENOUS | Status: DC | PRN
  Administered 2018-04-12: 40 mg via INTRAVENOUS

## 2018-04-12 MED ORDER — SUGAMMADEX SODIUM 200 MG/2ML IV SOLN
INTRAVENOUS | Status: DC | PRN
  Administered 2018-04-12: 157 mg via INTRAVENOUS

## 2018-04-12 MED ORDER — ACETAMINOPHEN 10 MG/ML IV SOLN: 1000.0000 mg | Freq: Once | INTRAVENOUS | Status: DC | PRN

## 2018-04-12 MED ORDER — BUPIVACAINE-EPINEPHRINE (PF) 0.5% -1:200000 IJ SOLN
INTRAMUSCULAR | Status: AC
  Filled 2018-04-12: qty 30

## 2018-04-12 MED ORDER — DEXTROSE 5 % IV SOLN
INTRAVENOUS | Status: DC | PRN
  Administered 2018-04-12: 09:00:00 via INTRAVENOUS

## 2018-04-12 MED ORDER — EPHEDRINE SULFATE 50 MG/ML IJ SOLN
INTRAMUSCULAR | Status: AC
  Filled 2018-04-12: qty 1

## 2018-04-12 MED ORDER — LACTATED RINGERS IV SOLN
INTRAVENOUS | Status: DC
  Administered 2018-04-12: 10:00:00 via INTRAVENOUS
  Administered 2018-04-12: 1000 mL via INTRAVENOUS

## 2018-04-12 MED ORDER — PROMETHAZINE HCL 25 MG/ML IJ SOLN: 6.2500 mg | INTRAMUSCULAR | Status: DC | PRN

## 2018-04-12 MED ORDER — FENTANYL CITRATE (PF) 250 MCG/5ML IJ SOLN
INTRAMUSCULAR | Status: AC
  Filled 2018-04-12: qty 5

## 2018-04-12 MED ORDER — BUPIVACAINE-EPINEPHRINE (PF) 0.5% -1:200000 IJ SOLN
INTRAMUSCULAR | Status: DC | PRN
  Administered 2018-04-12: 20 mL

## 2018-04-12 SURGICAL SUPPLY — 45 items
BAG HAMPER (MISCELLANEOUS) ×5 IMPLANT
BANDAGE STRIP 1X3 FLEXIBLE (GAUZE/BANDAGES/DRESSINGS) ×11 IMPLANT
BLADE SURG SZ11 CARB STEEL (BLADE) ×5 IMPLANT
CATH ROBINSON RED A/P 16FR (CATHETERS) ×5 IMPLANT
CLOSURE STERI-STRIP 1/4X4 (GAUZE/BANDAGES/DRESSINGS) ×5 IMPLANT
CLOSURE WOUND 1/2 X4 (GAUZE/BANDAGES/DRESSINGS) ×1
CLOTH BEACON ORANGE TIMEOUT ST (SAFETY) ×5 IMPLANT
COVER LIGHT HANDLE STERIS (MISCELLANEOUS) ×10 IMPLANT
DECANTER SPIKE VIAL GLASS SM (MISCELLANEOUS) ×5 IMPLANT
DURAPREP 26ML APPLICATOR (WOUND CARE) ×5 IMPLANT
ELECT REM PT RETURN 9FT ADLT (ELECTROSURGICAL) ×5
ELECTRODE REM PT RTRN 9FT ADLT (ELECTROSURGICAL) ×3 IMPLANT
FORMALIN 10 PREFIL 120ML (MISCELLANEOUS) ×5 IMPLANT
GAUZE SPONGE 4X4 12PLY STRL (GAUZE/BANDAGES/DRESSINGS) ×5 IMPLANT
GLOVE BIOGEL PI IND STRL 7.0 (GLOVE) ×6 IMPLANT
GLOVE BIOGEL PI IND STRL 9 (GLOVE) ×3 IMPLANT
GLOVE BIOGEL PI INDICATOR 7.0 (GLOVE) ×8
GLOVE BIOGEL PI INDICATOR 9 (GLOVE) ×2
GLOVE ECLIPSE 9.0 STRL (GLOVE) ×10 IMPLANT
GOWN SPEC L3 XXLG W/TWL (GOWN DISPOSABLE) ×5 IMPLANT
GOWN STRL REUS W/TWL LRG LVL3 (GOWN DISPOSABLE) ×5 IMPLANT
HANDPIECE ABLA MINERVA ENDO (MISCELLANEOUS) ×2 IMPLANT
INST SET HYSTEROSCOPY (KITS) ×5 IMPLANT
INST SET LAPROSCOPIC GYN AP (KITS) ×5 IMPLANT
IV NS IRRIG 3000ML ARTHROMATIC (IV SOLUTION) ×5 IMPLANT
KIT TURNOVER KIT A (KITS) ×5 IMPLANT
MANIFOLD NEPTUNE II (INSTRUMENTS) ×5 IMPLANT
NEEDLE INSUFFLATION 120MM (ENDOMECHANICALS) ×5 IMPLANT
NS IRRIG 1000ML POUR BTL (IV SOLUTION) ×5 IMPLANT
PACK PERI GYN (CUSTOM PROCEDURE TRAY) ×5 IMPLANT
PAD ARMBOARD 7.5X6 YLW CONV (MISCELLANEOUS) ×5 IMPLANT
PAD TELFA 3X4 1S STER (GAUZE/BANDAGES/DRESSINGS) ×5 IMPLANT
SET BASIN LINEN APH (SET/KITS/TRAYS/PACK) ×5 IMPLANT
SET CYSTO W/LG BORE CLAMP LF (SET/KITS/TRAYS/PACK) ×5 IMPLANT
SHEARS HARMONIC ACE PLUS 36CM (ENDOMECHANICALS) ×5 IMPLANT
SLEEVE ENDOPATH XCEL 5M (ENDOMECHANICALS) ×10 IMPLANT
SOLUTION ANTI FOG 6CC (MISCELLANEOUS) ×5 IMPLANT
STRIP CLOSURE SKIN 1/2X4 (GAUZE/BANDAGES/DRESSINGS) ×1 IMPLANT
SUT VIC AB 4-0 PS2 27 (SUTURE) ×5 IMPLANT
SYR 30ML LL (SYRINGE) ×5 IMPLANT
SYR CONTROL 10ML LL (SYRINGE) ×5 IMPLANT
SYRINGE 10CC LL (SYRINGE) ×5 IMPLANT
TROCAR XCEL NON-BLD 5MMX100MML (ENDOMECHANICALS) ×5 IMPLANT
TUBING INSUFFLATION (TUBING) ×5 IMPLANT
WARMER LAPAROSCOPE (MISCELLANEOUS) ×5 IMPLANT

## 2018-04-12 NOTE — Discharge Instructions (Signed)
Endometrial Ablation Endometrial ablation is a procedure that destroys the thin inner layer of the lining of the uterus (endometrium). This procedure may be done:  To stop heavy periods.  To stop bleeding that is causing anemia.  To control irregular bleeding.  To treat bleeding caused by small tumors (fibroids) in the endometrium.  This procedure is often an alternative to major surgery, such as removal of the uterus and cervix (hysterectomy). As a result of this procedure:  You may not be able to have children. However, if you are premenopausal (you have not gone through menopause): ? You may still have a small chance of getting pregnant. ? You will need to use a reliable method of birth control after the procedure to prevent pregnancy.  You may stop having a menstrual period, or you may have only a small amount of bleeding during your period. Menstruation may return several years after the procedure.  Tell a health care provider about:  Any allergies you have.  All medicines you are taking, including vitamins, herbs, eye drops, creams, and over-the-counter medicines.  Any problems you or family members have had with the use of anesthetic medicines.  Any blood disorders you have.  Any surgeries you have had.  Any medical conditions you have. What are the risks? Generally, this is a safe procedure. However, problems may occur, including:  A hole (perforation) in the uterus or bowel.  Infection of the uterus, bladder, or vagina.  Bleeding.  Damage to other structures or organs.  An air bubble in the lung (air embolus).  Problems with pregnancy after the procedure.  Failure of the procedure.  Decreased ability to diagnose cancer in the endometrium.  What happens before the procedure?  You will have tests of your endometrium to make sure there are no pre-cancerous cells or cancer cells present.  You may have an ultrasound of the uterus.  You may be given  medicines to thin the endometrium.  Ask your health care provider about: ? Changing or stopping your regular medicines. This is especially important if you take diabetes medicines or blood thinners. ? Taking medicines such as aspirin and ibuprofen. These medicines can thin your blood. Do not take these medicines before your procedure if your doctor tells you not to.  Plan to have someone take you home from the hospital or clinic. What happens during the procedure?  You will lie on an exam table with your feet and legs supported as in a pelvic exam.  To lower your risk of infection: ? Your health care team will wash or sanitize their hands and put on germ-free (sterile) gloves. ? Your genital area will be washed with soap.  An IV tube will be inserted into one of your veins.  You will be given a medicine to help you relax (sedative).  A surgical instrument with a light and camera (resectoscope) will be inserted into your vagina and moved into your uterus. This allows your surgeon to see inside your uterus.  Endometrial tissue will be removed using one of the following methods: ? Radiofrequency. This method uses a radiofrequency-alternating electric current to remove the endometrium. ? Cryotherapy. This method uses extreme cold to freeze the endometrium. ? Heated-free liquid. This method uses a heated saltwater (saline) solution to remove the endometrium. ? Microwave. This method uses high-energy microwaves to heat up the endometrium and remove it. ? Thermal balloon. This method involves inserting a catheter with a balloon tip into the uterus. The balloon tip is  filled with heated fluid to remove the endometrium. The procedure may vary among health care providers and hospitals. What happens after the procedure?  Your blood pressure, heart rate, breathing rate, and blood oxygen level will be monitored until the medicines you were given have worn off.  As tissue healing occurs, you may  notice vaginal bleeding for 4-6 weeks after the procedure. You may also experience: ? Cramps. ? Thin, watery vaginal discharge that is light pink or brown in color. ? A need to urinate more frequently than usual. ? Nausea.  Do not drive for 24 hours if you were given a sedative.  Do not have sex or insert anything into your vagina until your health care provider approves. Summary  Endometrial ablation is done to treat the many causes of heavy menstrual bleeding.  The procedure may be done only after medications have been tried to control the bleeding.  Plan to have someone take you home from the hospital or clinic. This information is not intended to replace advice given to you by your health care provider. Make sure you discuss any questions you have with your health care provider. Document Released: 10/09/2004 Document Revised: 12/17/2016 Document Reviewed: 12/17/2016 Elsevier Interactive Patient Education  2017 [place] Anesthesia, Adult, Care After These instructions provide you with information about caring for yourself after your procedure. Your health care provider may also give you more specific instructions. Your treatment has been planned according to current medical practices, but problems sometimes occur. Call your health care provider if you have any problems or questions after your procedure. What can I expect after the procedure? After the procedure, it is common to have:  Vomiting.  A sore throat.  Mental slowness.  It is common to feel:  Nauseous.  Cold or shivery.  Sleepy.  Tired.  Sore or achy, even in parts of your body where you did not have surgery.  Follow these instructions at home: For at least 24 hours after the procedure:  Do not: ? Participate in activities where you could fall or become injured. ? Drive. ? Use heavy machinery. ? Drink alcohol. ? Take sleeping pills or medicines that cause drowsiness. ? Make important  decisions or sign legal documents. ? Take care of children on your own.  Rest. Eating and drinking  If you vomit, drink water, juice, or soup when you can drink without vomiting.  Drink enough fluid to keep your urine clear or pale yellow.  Make sure you have little or no nausea before eating solid foods.  Follow the diet recommended by your health care provider. General instructions  Have a responsible adult stay with you until you are awake and alert.  Return to your normal activities as told by your health care provider. Ask your health care provider what activities are safe for you.  Take over-the-counter and prescription medicines only as told by your health care provider.  If you smoke, do not smoke without supervision.  Keep all follow-up visits as told by your health care provider. This is important. Contact a health care provider if:  You continue to have nausea or vomiting at home, and medicines are not helpful.  You cannot drink fluids or start eating again.  You cannot urinate after 8-12 hours.  You develop a skin rash.  You have fever.  You have increasing redness at the site of your procedure. Get help right away if:  You have difficulty breathing.  You have chest pain.  You have  unexpected bleeding.  You feel that you are having a life-threatening or urgent problem. This information is not intended to replace advice given to you by your health care provider. Make sure you discuss any questions you have with your health care provider. Document Released: 03/08/2001 Document Revised: 05/04/2016 Document Reviewed: 11/14/2015 Elsevier Interactive Patient Education  [employer].

## 2018-04-12 NOTE — Transfer of Care (Signed)
Immediate Anesthesia Transfer of Care Note  Patient: Stephanie Paul  Procedure(s) Performed: DILATATION  /HYSTEROSCOPY (N/A Cervix) ENDOMETRIAL ABLATION MINERVA (N/A Uterus) LAPAROSCOPIC BILATERAL SALPINGECTOMY (Bilateral Abdomen)  Patient Location: PACU  Anesthesia Type:MAC  Level of Consciousness: awake, alert , oriented and patient cooperative  Airway & Oxygen Therapy: Patient Spontanous Breathing  Post-op Assessment: Report given to RN and Post -op Vital signs reviewed and stable  Post vital signs: Reviewed and stable  Last Vitals:  Vitals Value Taken Time  BP 111/71 04/12/2018 10:15 AM  Temp 37.1 C 04/12/2018 10:15 AM  Pulse 98 04/12/2018 10:16 AM  Resp 12 04/12/2018 10:16 AM  SpO2 98 % 04/12/2018 10:16 AM  Vitals shown include unvalidated device data.  Last Pain:  Vitals:   04/12/18 0814  TempSrc: Oral  PainSc: 0-No pain      Patients Stated Pain Goal: 7 ([id number])  Complications: No apparent anesthesia complications

## 2018-04-12 NOTE — Op Note (Signed)
See the brief operative note for surgical details 

## 2018-04-12 NOTE — H&P (Signed)
Editor: Jonnie Kind, MD (Physician)  Prior Versions: 1. Jonnie Kind, MD (Physician) at 03/25/2018 9:17 AM - Signed   2. Blue, Soijett A at 03/25/2018 9:10 AM - Shared   Expand All Collapse All   Preoperative History and Physical  Stephanie Paul is a 42 y.o. G1P1 here for surgical management of AUB. No significant preoperative concerns. She denies any complaints at this time.   Proposed surgery: bilateral salpingectomy and endometrial ablation      Past Medical History:  Diagnosis Date  . Anal fissure 05/27/2016  . Asthma   . Contraceptive management 01/30/2015  . Hemorrhoids 05/27/2016  . Left ovarian cyst 09/21/2014  . Menstrual cramps 09/21/2014  . Ovarian cyst, right    History reviewed. No pertinent surgical history.                 OB History  Gravida Para Term Preterm AB Living  1 1       1   SAB TAB Ectopic Multiple Live Births             1       # Outcome Date GA Lbr Len/2nd Weight Sex Delivery Anes PTL Lv  1 Para 06/27/09    F Vag-Spont   LIV  Patient denies any other pertinent gynecologic issues.         Current Outpatient Medications on File Prior to Visit  Medication Sig Dispense Refill  . albuterol (PROVENTIL HFA;VENTOLIN HFA) 108 (90 BASE) MCG/ACT inhaler Inhale 2 puffs into the lungs every 6 (six) hours as needed for wheezing or shortness of breath.    . beclomethasone (QVAR) 40 MCG/ACT inhaler Inhale 1 puff into the lungs as needed.    . hydrocortisone-pramoxine (ANALPRAM HC) 2.5-1 % rectal cream Place 1 application rectally 3 (three) times daily. 30 g 3  . Multiple Vitamin (MULTIVITAMIN) tablet Take 1 tablet by mouth daily.    Marland Kitchen ketorolac (TORADOL) 10 MG tablet Take 1 tablet (10 mg total) by mouth every 6 (six) hours as needed. (Patient not taking: Reported on 03/14/2018) 20 tablet 0   No current facility-administered medications on file prior to visit.        Allergies  Allergen Reactions  . Penicillins Hives and Rash     Social History:   reports that she has quit smoking. Her smoking use included cigarettes. She quit after 10.00 years of use. She has never used smokeless tobacco. She reports that she drinks about 1.2 oz of alcohol per week. She reports that she does not use drugs.       Family History  Problem Relation Age of Onset  . Cancer Paternal Grandfather        leukemia  . Hyperlipidemia Paternal Grandmother   . Hypertension Paternal Grandmother   . Diabetes Maternal Grandfather     Review of Systems: Noncontributory  PHYSICAL EXAM: Blood pressure (!) 164/108, pulse 97, height 5\' 3"  (1.6 m), weight 180 lb (81.6 kg), last menstrual period 03/21/2018. General appearance - alert, well appearing, and in no distress Chest - clear to auscultation, no wheezes, rales or rhonchi, symmetric air entry Heart - normal rate and regular rhythm Abdomen - soft, nontender, nondistended, no masses or organomegaly Pelvic - normal external genitalia VAGINA: normal menstrual flow CERVIX: multiparous os UTERUS: anterior. Normal size, shape, and contour ADNEXA: normal adnexa in size, nontender and no masses. Extremities - peripheral pulses normal, no pedal edema, no clubbing or cyanosis  Labs: CBC  Component Value Date/Time   WBC 8.9 04/06/2018 1333   RBC 3.67 (L) 04/06/2018 1333   HGB 12.2 04/06/2018 1333   HCT 35.9 (L) 04/06/2018 1333   PLT 228 04/06/2018 1333   MCV 97.8 04/06/2018 1333   MCH 33.2 04/06/2018 1333   MCHC 34.0 04/06/2018 1333   RDW 12.1 04/06/2018 1333   LYMPHSABS 1.3 06/23/2009 1125   MONOABS 0.9 06/23/2009 1125   EOSABS 0.1 06/23/2009 1125   BASOSABS 0.0 06/23/2009 1125   CMP     Component Value Date/Time   NA 138 04/06/2018 1333   K 3.6 04/06/2018 1333   CL 107 04/06/2018 1333   CO2 23 04/06/2018 1333   GLUCOSE 100 (H) 04/06/2018 1333   BUN 15 04/06/2018 1333   CREATININE 0.72 04/06/2018 1333   CREATININE 0.94 03/29/2013 0952   CALCIUM 8.9 04/06/2018  1333   PROT 7.0 04/06/2018 1333   ALBUMIN 4.0 04/06/2018 1333   AST 20 04/06/2018 1333   ALT 19 04/06/2018 1333   ALKPHOS 51 04/06/2018 1333   BILITOT 1.0 04/06/2018 1333   GFRNONAA >60 04/06/2018 1333   GFRAA >60 04/06/2018 1333     Imaging Studies:  ImagingResults  US Pelvis Transvanginal Non-ob (tv Only)  Result Date: 03/13/2018 GYNECOLOGIC SONOGRAM Stephanie Paul is a 77 y.o. G1P1 LMP 02/26/2018,she is here for a pelvic sonogram for RLQ pain w/abnormal uterine bleeding,, "four periods" in the last 2 months Uterus                      8.5 x 4.4 x 5.3 cm, vol 103 ml, homogeneous anteverted uterus,wnl Endometrium          9.6 mm, symmetrical, wnl Right ovary             2.9 x 2.6 x 2.5 cm, wnl Left ovary                3.9 x 1.8 x 2.7 cm, simple left ovarian cyst 3 x 2.8 x 1.3 cm No free fluid Technician Comments: PELVIC US TA/TV: homogeneous anteverted uterus,wnl,EEC 9.6 cm,normal right ovary,simple left ovarian cyst 3 x 2.8 x 1.3 cm,ovaries appear mobile,right adnexal discomfort during ultrasound,no free fluid [employer] 03/10/2018 11:37 AM Clinical Impression and recommendations: I have reviewed the sonogram results above. Combined with the patient's current clinical course, below are my impressions and any appropriate recommendations for management based on the sonographic findings: 1. Normal appearing anteflexed uterus of normal size with symmetrical bilaminar endometrial tissues, proliferative phase suspected 2.  Normal adnexal structures, simple left ovarian cyst Jonnie Kind   US Pelvis Complete  Result Date: 03/13/2018 GYNECOLOGIC SONOGRAM Stephanie Paul is a 36 y.o. G1P1 LMP 02/26/2018,she is here for a pelvic sonogram for RLQ pain w/abnormal uterine bleeding,, "four periods" in the last 2 months Uterus                      8.5 x 4.4 x 5.3 cm, vol 103 ml, homogeneous anteverted uterus,wnl Endometrium          9.6 mm, symmetrical, wnl Right ovary             2.9 x 2.6 x  2.5 cm, wnl Left ovary                3.9 x 1.8 x 2.7 cm, simple left ovarian cyst 3 x 2.8 x 1.3 cm No free fluid Technician Comments: PELVIC US TA/TV: homogeneous  anteverted uterus,wnl,EEC 9.6 cm,normal right ovary,simple left ovarian cyst 3 x 2.8 x 1.3 cm,ovaries appear mobile,right adnexal discomfort during ultrasound,no free fluid [employer] [place] 03/10/2018 11:37 AM Clinical Impression and recommendations: I have reviewed the sonogram results above. Combined with the patient's current clinical course, below are my impressions and any appropriate recommendations for management based on the sonographic findings: 1. Normal appearing anteflexed uterus of normal size with symmetrical bilaminar endometrial tissues, proliferative phase suspected 2.  Normal adnexal structures, simple left ovarian cyst Jonnie Kind     Assessment: menorrhagia                        Desire for sterilization                       Hypertension, (anxiety vs CHTN)     Patient Active Problem List   Diagnosis Date Noted  . Encounter for well woman exam with routine gynecological exam 09/20/2017  . Dysmenorrhea 09/20/2017  . Hemorrhoids 05/27/2016  . Anal fissure 05/27/2016  . Contraceptive management 01/30/2015  . Left ovarian cyst 09/21/2014  . Menstrual cramps 09/21/2014  . GERD (gastroesophageal reflux disease) 04/05/2013  . right ovarian cyst 03/28/2013    Plan: Patient will undergo surgical management with endometrial ablation and bilateral salpingectomy.  Begun on Norvasc 5 qd

## 2018-04-12 NOTE — Anesthesia Postprocedure Evaluation (Signed)
Anesthesia Post Note  Patient: ZAMARIA BRAZZLE  Procedure(s) Performed: DILATATION  /HYSTEROSCOPY (N/A Cervix) ENDOMETRIAL ABLATION MINERVA (N/A Uterus) LAPAROSCOPIC BILATERAL SALPINGECTOMY (Bilateral Abdomen)  Patient location during evaluation: PACU Anesthesia Type: General Level of consciousness: awake and alert, oriented and patient cooperative Pain management: pain level controlled Vital Signs Assessment: post-procedure vital signs reviewed and stable Respiratory status: spontaneous breathing and respiratory function stable Cardiovascular status: stable Postop Assessment: no apparent nausea or vomiting Anesthetic complications: no     Last Vitals:  Vitals:   04/12/18 1030 04/12/18 1045  BP: 119/78 113/78  Pulse: 87 78  Resp: 12 13  Temp:    SpO2: 98% 99%    Last Pain:  Vitals:   04/12/18 1045  TempSrc:   PainSc: 6                  ADAMS, AMY A

## 2018-04-12 NOTE — Anesthesia Procedure Notes (Signed)
Procedure Name: Intubation Date/Time: 04/12/2018 9:05 AM Performed by: Andree Elk, Amy A, CRNA Pre-anesthesia Checklist: Patient identified, Patient being monitored, Timeout performed, Emergency Drugs available and Suction available Patient Re-evaluated:Patient Re-evaluated prior to induction Oxygen Delivery Method: Circle System Utilized Preoxygenation: Pre-oxygenation with 100% oxygen Induction Type: IV induction Ventilation: Mask ventilation without difficulty Laryngoscope Size: Mac and 3 Grade View: Grade I Tube type: Oral Tube size: 7.0 mm Number of attempts: 1 Airway Equipment and Method: Stylet Placement Confirmation: ETT inserted through vocal cords under direct vision,  positive ETCO2 and breath sounds checked- equal and bilateral Secured at: 21 cm Tube secured with: Tape Dental Injury: Teeth and Oropharynx as per pre-operative assessment

## 2018-04-12 NOTE — Anesthesia Preprocedure Evaluation (Signed)
Anesthesia Evaluation  Patient identified by MRN, date of birth, ID band Patient awake    Reviewed: Allergy & Precautions, H&P , NPO status , Patient's Chart, lab work & pertinent test results, reviewed documented beta blocker date and time   Airway Mallampati: II  TM Distance: >3 FB Neck ROM: full    Dental no notable dental hx. (+) Teeth Intact   Pulmonary neg pulmonary ROS, former smoker,    Pulmonary exam normal breath sounds clear to auscultation       Cardiovascular Exercise Tolerance: Good hypertension, negative cardio ROS   Rhythm:regular Rate:Normal     Neuro/Psych negative neurological ROS  negative psych ROS   GI/Hepatic negative GI ROS, Neg liver ROS,   Endo/Other  negative endocrine ROS  Renal/GU negative Renal ROS  negative genitourinary   Musculoskeletal   Abdominal   Peds  Hematology negative hematology ROS (+)   Anesthesia Other Findings Recently diagnosed with hypertension No clinical complaints  Reproductive/Obstetrics negative OB ROS                             Anesthesia Physical Anesthesia Plan  ASA: II  Anesthesia Plan: General   Post-op Pain Management:    Induction:   PONV Risk Score and Plan:   Airway Management Planned:   Additional Equipment:   Intra-op Plan:   Post-operative Plan:   Informed Consent: I have reviewed the patients History and Physical, chart, labs and discussed the procedure including the risks, benefits and alternatives for the proposed anesthesia with the patient or authorized representative who has indicated his/her understanding and acceptance.   Dental Advisory Given  Plan Discussed with: CRNA  Anesthesia Plan Comments:         Anesthesia Quick Evaluation

## 2018-04-12 NOTE — Op Note (Signed)
04/12/2018  10:19 AM  PATIENT:  Stephanie Paul  42 y.o. female  PRE-OPERATIVE DIAGNOSIS:  menorrhagia Sterilization  POST-OPERATIVE DIAGNOSIS:  menorrhagia, desires permanent Sterilization  PROCEDURE:  Procedure(s): DILATATION  /HYSTEROSCOPY (N/A) ENDOMETRIAL ABLATION MINERVA (N/A) LAPAROSCOPIC BILATERAL SALPINGECTOMY (Bilateral)  SURGEON:  Surgeon(s) and Role:    * Jonnie Kind, MD - Primary  PHYSICIAN ASSISTANT:   ASSISTANTS: None  ANESTHESIA:   local, general and paracervical block  EBL:  5 mL   BLOOD ADMINISTERED:none  DRAINS: none   LOCAL MEDICATIONS USED:  MARCAINE    and Amount: 20 ml  SPECIMEN:  Source of Specimen:  Bilateral fallopian tubes  DISPOSITION OF SPECIMEN:  PATHOLOGY  COUNTS:  YES  TOURNIQUET:  * No tourniquets in log *  DICTATION: .Dragon Dictation  PLAN OF CARE: Discharge to home after PACU  PATIENT DISPOSITION:  PACU - hemodynamically stable.   Delay start of Pharmacological VTE agent (>24hrs) due to surgical blood loss or risk of bleeding: not applicable Details of procedure: She was taken to the operating room prepped and draped for combined abdominal vaginal procedure with timeout conducted and procedure confirmed by operative team.  Ancef 2 g was administered IV.  Attention was first directed to the abdomen where infraumbilical vertical 1 cm suprapubic and right lower quadrant 8 mm incisions were made with Veress needle used through the umbilicus being careful oriented tip toward the pelvis elevating the abdominal wall and using water droplet technique to confirm free flow into the abdominal cavity.  Pneumoperitoneum was achieved under 6 mmHg pressure to 3 L CO2 and a 5 mm laparoscopic trocar used under direct visualization into the abdominal cavity through the umbilicus.  There was no suspicion of bleeding or trauma.  The suprapubic and right lower quadrant trochars were inserted under direct visualization and attention then directed  to the pelvis.  With a sponge stick in the vagina to elevate the vagina and cervix we were able to identify the fallopian tubes on each side and harmonic a 7 device was used to amputate the fallopian tube on each side.  These were extracted and sent as a specimen. 60 cc of saline was instilled in the abdomen laparoscopic trochars removed and subcuticular 4-0 Vicryl closure of the skin incisions was followed by Steri-Strip placement and Band-Aids. Endometrial ablation. Ablation sequence was then conducted.  The surgeon repositioned to the vaginal area, speculum was inserted cervix, grasped with single-tooth tenaculum at 12:00, paracervical block applied using 20 cc of Marcaine with epinephrine, then the uterus sounded to 8 cm dilated to 23 [place] allowing introduction of the hysteroscope which showed an intact uterine cavity with thin atrophic endometrium (patient had been on Provera) with no suspected lesions.  Photodocumentation was performed the Minerva endometrial ablation device was then prepared inserted activated with length of 4.5 cm effective coagulation, and the 122nd ablation sequence completed.  The equipment was removed and patient to recovery room in stable condition sponge and needle counts correct

## 2018-04-13 ENCOUNTER — Encounter (HOSPITAL_COMMUNITY): Payer: Self-pay | Admitting: Obstetrics and Gynecology

## 2018-04-21 NOTE — Addendum Note (Signed)
Addendum  created 04/21/18 1210 by Vista Deck, CRNA   Intraprocedure Staff edited

## 2018-04-25 ENCOUNTER — Encounter: Payer: Self-pay | Admitting: Obstetrics and Gynecology

## 2018-04-25 ENCOUNTER — Ambulatory Visit (INDEPENDENT_AMBULATORY_CARE_PROVIDER_SITE_OTHER): Payer: BLUE CROSS/BLUE SHIELD | Admitting: Obstetrics and Gynecology

## 2018-04-25 VITALS — BP 122/80 | HR 78 | Ht 63.0 in | Wt 175.6 lb

## 2018-04-25 DIAGNOSIS — Z9889 Other specified postprocedural states: Secondary | ICD-10-CM

## 2018-04-25 DIAGNOSIS — Z09 Encounter for follow-up examination after completed treatment for conditions other than malignant neoplasm: Secondary | ICD-10-CM

## 2018-04-25 NOTE — Progress Notes (Signed)
   Subjective:  Stephanie Paul is a 42 y.o. female now 2 weeks status post DILATATION  /HYSTEROSCOPY (N/A) ENDOMETRIAL ABLATION MINERVA (N/A) LAPAROSCOPIC BILATERAL SALPINGECTOMY (Bilateral) She says life is treating her good. And recovery is fine.    Review of Systems Negative except some watery discharge.   Diet:   normal   Bowel movements : normal.  The patient is not having any pain.  Objective:  BP 122/80 (BP Location: Right Arm, Patient Position: Sitting, Cuff Size: Normal)   Pulse 78   Ht 5\' 3"  (1.6 m)   Wt 175 lb 9.6 oz (79.7 kg)   BMI 31.11 kg/m  General:Well developed, well nourished.  No acute distress. Abdomen: Bowel sounds normal, soft, non-tender. Pelvic Exam:    External Genitalia:  Normal.    Vagina: Normal    Cervix: Normal    Uterus: Normal    Adnexa/Bimanual: Normal  Incision(s):   Healing well, no drainage, no erythema, no hernia, no swelling, no dehiscence,     Assessment:  Post-Op 2 weeks s/p D&C, endometrial ablation minerva, LAPAROSCOPIC BILATERAL SALPINGECTOMY (Bilateral)   Doing well postoperatively.   Plan:  1.Wound care discussed  2. current medications. 3. Activity restrictions: no lifting more than 25 pounds, no sexual activity until discharge clears 4. return to work: 4 weeks. 5. Follow up PRN. Or in 3 years for Pap/PE   By signing my name below, I, Izna Ahmed, attest that this documentation has been prepared under the direction and in the presence of Jonnie Kind, MD. Electronically Signed: Jabier Gauss, Medical Scribe. 04/25/18. 1:30 PM.  I personally performed the services described in this documentation, which was SCRIBED in my presence. The recorded information has been reviewed and considered accurate. It has been edited as necessary during review. Jonnie Kind, MD

## 2019-01-10 ENCOUNTER — Other Ambulatory Visit (HOSPITAL_COMMUNITY): Payer: Self-pay | Admitting: Family Medicine

## 2019-01-10 DIAGNOSIS — Z1231 Encounter for screening mammogram for malignant neoplasm of breast: Secondary | ICD-10-CM

## 2019-01-23 ENCOUNTER — Encounter (HOSPITAL_COMMUNITY): Payer: Self-pay | Admitting: Radiology

## 2019-01-23 ENCOUNTER — Ambulatory Visit (HOSPITAL_COMMUNITY)
Admission: RE | Admit: 2019-01-23 | Discharge: 2019-01-23 | Disposition: A | Discharge status: Home / Self Care | Payer: BLUE CROSS/BLUE SHIELD | Source: Ambulatory Visit | Attending: Family Medicine | Admitting: Family Medicine

## 2019-01-23 DIAGNOSIS — Z1231 Encounter for screening mammogram for malignant neoplasm of breast: Secondary | ICD-10-CM | POA: Diagnosis present

## 2019-07-09 ENCOUNTER — Encounter (HOSPITAL_COMMUNITY): Payer: Self-pay | Admitting: Emergency Medicine

## 2019-07-09 ENCOUNTER — Emergency Department (HOSPITAL_COMMUNITY)
Admission: EM | Admit: 2019-07-09 | Discharge: 2019-07-09 | Disposition: A | Discharge status: Home / Self Care | Payer: BC Managed Care – PPO | Attending: Emergency Medicine | Admitting: Emergency Medicine

## 2019-07-09 ENCOUNTER — Other Ambulatory Visit: Payer: Self-pay

## 2019-07-09 ENCOUNTER — Emergency Department (HOSPITAL_COMMUNITY): Payer: BC Managed Care – PPO

## 2019-07-09 DIAGNOSIS — Z87891 Personal history of nicotine dependence: Secondary | ICD-10-CM | POA: Insufficient documentation

## 2019-07-09 DIAGNOSIS — J45909 Unspecified asthma, uncomplicated: Secondary | ICD-10-CM | POA: Insufficient documentation

## 2019-07-09 DIAGNOSIS — I10 Essential (primary) hypertension: Secondary | ICD-10-CM | POA: Insufficient documentation

## 2019-07-09 DIAGNOSIS — Z79899 Other long term (current) drug therapy: Secondary | ICD-10-CM | POA: Diagnosis not present

## 2019-07-09 DIAGNOSIS — R0789 Other chest pain: Secondary | ICD-10-CM | POA: Diagnosis present

## 2019-07-09 DIAGNOSIS — R079 Chest pain, unspecified: Secondary | ICD-10-CM

## 2019-07-09 LAB — BASIC METABOLIC PANEL
Anion gap: 8 (ref 5–15)
BUN: 12 mg/dL (ref 6–20)
CO2: 21 mmol/L — ABNORMAL LOW (ref 22–32)
Calcium: 8.9 mg/dL (ref 8.9–10.3)
Chloride: 107 mmol/L (ref 98–111)
Creatinine, Ser: 0.8 mg/dL (ref 0.44–1.00)
GFR calc Af Amer: >60 mL/min (ref >60)
GFR calc non Af Amer: >60 mL/min (ref >60)
Glucose, Bld: 104 mg/dL — ABNORMAL HIGH (ref 70–99)
Potassium: 4.1 mmol/L (ref 3.5–5.1)
Sodium: 136 mmol/L (ref 135–145)

## 2019-07-09 LAB — HEPATIC FUNCTION PANEL
ALT: 22 U/L (ref 0–44)
AST: 20 U/L (ref 15–41)
Albumin: 4.3 g/dL (ref 3.5–5.0)
Alkaline Phosphatase: 54 U/L (ref 38–126)
Bilirubin, Direct: 0.1 mg/dL (ref 0.0–0.2)
Indirect Bilirubin: 1.1 mg/dL — ABNORMAL HIGH (ref 0.3–0.9)
Total Bilirubin: 1.2 mg/dL (ref 0.3–1.2)
Total Protein: 7.6 g/dL (ref 6.5–8.1)

## 2019-07-09 LAB — LIPASE, BLOOD
Lipase: 31 U/L (ref 11–51)
Lipase: 39 U/L (ref 11–51)

## 2019-07-09 LAB — CBC
HCT: 38.5 % (ref 36.0–46.0)
Hemoglobin: 13 g/dL (ref 12.0–15.0)
MCH: 34 pg (ref 26.0–34.0)
MCHC: 33.8 g/dL (ref 30.0–36.0)
MCV: 100.8 fL — ABNORMAL HIGH (ref 80.0–100.0)
Platelets: 222 10*3/uL (ref 150–400)
RBC: 3.82 MIL/uL — ABNORMAL LOW (ref 3.87–5.11)
RDW: 12.3 % (ref 11.5–15.5)
WBC: 7.4 10*3/uL (ref 4.0–10.5)
nRBC: 0 % (ref 0.0–0.2)

## 2019-07-09 LAB — TROPONIN I (HIGH SENSITIVITY)
Troponin I (High Sensitivity): 4 ng/L (ref <18)
Troponin I (High Sensitivity): <2 ng/L (ref <18)

## 2019-07-09 NOTE — ED Triage Notes (Signed)
Pt states that she chest aches since yesterday in her right shoulder.

## 2019-07-09 NOTE — Discharge Instructions (Addendum)
You were seen in the emergency department for right-sided chest pain that radiated into your upper back.  You had blood work EKG and a chest x-ray that did not show an obvious cause of your symptoms.  We are setting you up for an ultrasound tomorrow to look at your gallbladder..  Please follow-up with your doctor and return if any fever or worsening symptoms.

## 2019-07-09 NOTE — ED Provider Notes (Signed)
[facility] EMERGENCY DEPARTMENT Provider Note   CSN: [id number] Arrival date & time: 07/09/19  1318     History   Chief Complaint Chief Complaint  Patient presents with  . Chest Pain    HPI Stephanie Paul is a 43 y.o. female.  For about 4 weeks she has had on and off right-sided chest discomfort that radiates through her shoulder and in between her shoulder blades.  It seems to happen about 20 minutes after she eats.  It makes her feel lightheaded like she might pass out.  She is seen her doctor for it and she has a stress test scheduled for September.  She said the discomfort got worse since yesterday and is been steady.  No fevers or chills no shortness of breath no cough no abdominal pain vomiting or diarrhea.  No urinary symptoms.  It does not seem to be reproducible with any kind of movement.      Chest Pain Pain location:  R chest Pain quality: aching   Pain radiates to:  R shoulder Pain severity:  Moderate Onset quality:  Gradual Timing:  Intermittent Progression:  Worsening Chronicity:  New Context: eating   Relieved by:  None tried Worsened by:  Nothing Ineffective treatments:  None tried Associated symptoms: back pain and dizziness   Associated symptoms: no abdominal pain, no cough, no diaphoresis, no fever, no headache, no heartburn, no lower extremity edema, no nausea, no numbness, no palpitations, no shortness of breath, no vomiting and no weakness   Risk factors: hypertension   Risk factors: no coronary artery disease, no diabetes mellitus and no prior DVT/PE     Past Medical History:  Diagnosis Date  . Anal fissure 05/27/2016  . Asthma   . Contraceptive management 01/30/2015  . Hemorrhoids 05/27/2016  . Hypertension   . Left ovarian cyst 09/21/2014  . Menstrual cramps 09/21/2014  . Ovarian cyst, right     Patient Active Problem List   Diagnosis Date Noted  . Encounter for well woman exam with routine gynecological exam 09/20/2017  . Dysmenorrhea  09/20/2017  . Hemorrhoids 05/27/2016  . Anal fissure 05/27/2016  . Contraceptive management 01/30/2015  . Left ovarian cyst 09/21/2014  . Menstrual cramps 09/21/2014  . GERD (gastroesophageal reflux disease) 04/05/2013  . right ovarian cyst 03/28/2013    Past Surgical History:  Procedure Laterality Date  . ENDOMETRIAL ABLATION N/A 04/12/2018   Procedure: ENDOMETRIAL ABLATION MINERVA;  Surgeon: Jonnie Kind, MD;  Location: [facility];  Service: Gynecology;  Laterality: N/A;  . HYSTEROSCOPY W/D&C N/A 04/12/2018   Procedure: DILATATION  Pollyann Glen;  Surgeon: Jonnie Kind, MD;  Location: [facility];  Service: Gynecology;  Laterality: N/A;  . LAPAROSCOPIC BILATERAL SALPINGECTOMY Bilateral 04/12/2018   Procedure: LAPAROSCOPIC BILATERAL SALPINGECTOMY;  Surgeon: Jonnie Kind, MD;  Location: [facility];  Service: Gynecology;  Laterality: Bilateral;  . WISDOM TOOTH EXTRACTION       OB History    Gravida  1   Para  1   Term      Preterm      AB      Living  1     SAB      TAB      Ectopic      Multiple      Live Births  1            Home Medications    Prior to Admission medications   Medication Sig Start Date End Date Taking? Authorizing Provider  albuterol (PROVENTIL HFA;VENTOLIN HFA) 108 (90 BASE) MCG/ACT inhaler Inhale 2 puffs into the lungs every 6 (six) hours as needed for wheezing or shortness of breath.    [provider]  amLODipine (NORVASC) 5 MG tablet Take 1 tablet (5 mg total) by mouth daily. 03/25/18   Jonnie Kind, MD  ketorolac (TORADOL) 10 MG tablet Take 1 tablet (10 mg total) by mouth every 6 (six) hours as needed (five day limit postop). Patient not taking: Reported on 04/25/2018 04/12/18   Jonnie Kind, MD  Multiple Vitamin (MULTIVITAMIN) tablet Take 1 tablet by mouth daily.    [provider]  traMADol (ULTRAM) 50 MG tablet Take 1 tablet (50 mg total) by mouth every 6 (six) hours as needed for moderate pain or severe pain.  Patient not taking: Reported on 04/25/2018 04/12/18   Jonnie Kind, MD    Family History Family History  Problem Relation Age of Onset  . Cancer Paternal Grandfather        leukemia  . Hyperlipidemia Paternal Grandmother   . Hypertension Paternal Grandmother   . Diabetes Maternal Grandfather     Social History Social History   Tobacco Use  . Smoking status: Former Smoker    Packs/day: 1.00    Years: 10.00    Pack years: 10.00    Types: Cigarettes    Quit date: 04/06/2008    Years since quitting: 11.2  . Smokeless tobacco: Never Used  . Tobacco comment: quit 2010 after smoking 10 yrs  Substance Use Topics  . Alcohol use: Yes    Alcohol/week: 2.0 standard drinks    Types: 2 Cans of beer per week    Comment: occ  . Drug use: No     Allergies   Penicillins   Review of Systems Review of Systems  Constitutional: Negative for diaphoresis and fever.  HENT: Negative for sore throat.   Eyes: Negative for visual disturbance.  Respiratory: Negative for cough and shortness of breath.   Cardiovascular: Positive for chest pain. Negative for palpitations.  Gastrointestinal: Negative for abdominal pain, heartburn, nausea and vomiting.  Genitourinary: Negative for dysuria.  Musculoskeletal: Positive for back pain.  Skin: Negative for rash.  Neurological: Positive for dizziness. Negative for weakness, numbness and headaches.     Physical Exam Updated Vital Signs BP (!) 156/102 (BP Location: Right Arm)   Pulse 98   Temp 98.2 F (36.8 C) (Oral)   Resp 18   Ht 5\' 2"  (1.575 m)   Wt 81.6 kg   SpO2 99%   BMI 32.92 kg/m   Physical Exam Vitals signs and nursing note reviewed.  Constitutional:      General: She is not in acute distress.    Appearance: She is well-developed.  HENT:     Head: Normocephalic and atraumatic.  Eyes:     Conjunctiva/sclera: Conjunctivae normal.  Neck:     Musculoskeletal: Neck supple.  Cardiovascular:     Rate and Rhythm: Normal rate and  regular rhythm.     Heart sounds: Normal heart sounds. No murmur.  Pulmonary:     Effort: Pulmonary effort is normal. No respiratory distress.     Breath sounds: Normal breath sounds.  Chest:     Chest wall: No mass, tenderness or crepitus.  Abdominal:     Palpations: Abdomen is soft.     Tenderness: There is no abdominal tenderness. There is no guarding or rebound.  Musculoskeletal: Normal range of motion.     Right lower  leg: She exhibits no tenderness. No edema.     Left lower leg: She exhibits no tenderness. No edema.  Skin:    General: Skin is warm and dry.     Capillary Refill: Capillary refill takes less than 2 seconds.  Neurological:     General: No focal deficit present.     Mental Status: She is alert and oriented to person, place, and time.      ED Treatments / Results  Labs (all labs ordered are listed, but only abnormal results are displayed) Labs Reviewed  BASIC METABOLIC PANEL - Abnormal; Notable for the following components:      Result Value   CO2 21 (*)    Glucose, Bld 104 (*)    All other components within normal limits  CBC - Abnormal; Notable for the following components:   RBC 3.82 (*)    MCV 100.8 (*)    All other components within normal limits  HEPATIC FUNCTION PANEL - Abnormal; Notable for the following components:   Indirect Bilirubin 1.1 (*)    All other components within normal limits  HEPATIC FUNCTION PANEL  LIPASE, BLOOD  LIPASE, BLOOD  POC URINE PREG, ED  TROPONIN I (HIGH SENSITIVITY)  TROPONIN I (HIGH SENSITIVITY)    EKG EKG Interpretation  Date/Time:  Sunday July 09 2019 13:25:27 EDT Ventricular Rate:  84 PR Interval:  140 QRS Duration: 90 QT Interval:  390 QTC Calculation: 460 R Axis:   46 Text Interpretation:  Normal sinus rhythm Normal ECG similar to prior 4/19 Confirmed by Aletta Edouard [id number]) on 07/09/2019 3:19:26 PM   Radiology Dg Chest 2 View  Result Date: 07/09/2019 CLINICAL DATA:  Chest pain. EXAM: CHEST - 2  VIEW COMPARISON:  12/13/2017 FINDINGS: The heart size and mediastinal contours are within normal limits. Both lungs are clear. The visualized skeletal structures are unremarkable. IMPRESSION: Normal exam. Electronically Signed   By: Lorriane Shire M.D.   On: 07/09/2019 14:00    Procedures Ultrasound ED Abd  Date/Time: 07/09/2019 4:57 PM Performed by: Hayden Rasmussen, MD Authorized by: Hayden Rasmussen, MD   Procedure details:    Indications: abdominal pain     Assessment for:  Gallstones   Hepatobiliary:  Visualized       Hepatobiliary findings:    Common bile duct:  Normal   Gallbladder wall:  Normal   Gallbladder stones: not identified     Mass: not identified     Intra-abdominal fluid: not identified     Polyps: not identified     Sonographic Murphy's sign: negative     (including critical care time)  Medications Ordered in ED Medications - No data to display   Initial Impression / Assessment and Plan / ED Course  I have reviewed the triage vital signs and the nursing notes.  Pertinent labs & imaging results that were available during my care of the patient were reviewed by me and considered in my medical decision making (see chart for details).  Clinical Course as of Jul 10 943  Sun Jul 08, 8433  3433 43 year old female generally healthy here with on-and-off right-sided chest pain radiating to her back for 4 weeks more steady for the last 2 days.  She was hypertensive in triage but otherwise not tachycardic not tachypneic not hypoxic.  Differential includes peptic ulcer disease cholelithiasis ACS pneumonia pneumothorax dissection musculoskeletal   [MB]  1702 Bedside ultrasound shows no obvious gallstones and normal gallbladder wall.  Formal ultrasound is not available today  and she is willing to have a test tomorrow.   [MB]    Clinical Course User Index [MB] Hayden Rasmussen, MD        Final Clinical Impressions(s) / ED Diagnoses   Final diagnoses:   Nonspecific chest pain    ED Discharge Orders         Ordered    US Abdomen Limited RUQ/Gall Gladder     07/09/19 1734           Hayden Rasmussen, MD 07/10/19 [phone]

## 2019-07-11 ENCOUNTER — Encounter (INDEPENDENT_AMBULATORY_CARE_PROVIDER_SITE_OTHER): Payer: Self-pay | Admitting: Internal Medicine

## 2019-07-11 ENCOUNTER — Ambulatory Visit (INDEPENDENT_AMBULATORY_CARE_PROVIDER_SITE_OTHER): Payer: BC Managed Care – PPO | Admitting: Internal Medicine

## 2019-07-11 ENCOUNTER — Other Ambulatory Visit: Payer: Self-pay

## 2019-07-11 DIAGNOSIS — R0789 Other chest pain: Secondary | ICD-10-CM | POA: Diagnosis not present

## 2019-07-11 DIAGNOSIS — R079 Chest pain, unspecified: Secondary | ICD-10-CM | POA: Insufficient documentation

## 2019-07-11 NOTE — Patient Instructions (Signed)
HIDA scan.

## 2019-07-11 NOTE — Progress Notes (Signed)
Subjective:    Patient ID: Stephanie Paul, female    DOB: 01-Feb-1976, 43 y.o.   MRN: [id number]  HPI Referred by the ED for rt sided chest discomfort radiating thru her shoulder blades. Usually occurs about 20 minutes after she eats. Seen in the ED 07/09/2019. Troponin's x 2 were normal. Hepatic normal except for slightly elevated indirect bilirubin. Lipase was normal. EKG was normal. BS [place] in the ED did not show any gallstones or wall thickening.  Has appt with Cardiology 9/2//2020.  She has symptoms several weeks. Nausea and dizziness with her chest pain. Thyroid levels were normal per patient. She says today she has pain in between her shoulder blades which comes and goes. Appetite is okay. No weight loss. BMs move okay, however she stays constipated. She has no abdominal pain. GERD controlled with Protonix.  She underwent and US abdomen back in 2012 for elevated bilirubin which was normal. HIDA scan also was normal.   CBC    Component Value Date/Time   WBC 7.4 07/09/2019 1454   RBC 3.82 (L) 07/09/2019 1454   HGB 13.0 07/09/2019 1454   HCT 38.5 07/09/2019 1454   PLT 222 07/09/2019 1454   MCV 100.8 (H) 07/09/2019 1454   MCH 34.0 07/09/2019 1454   MCHC 33.8 07/09/2019 1454   RDW 12.3 07/09/2019 1454   LYMPHSABS 1.3 06/23/2009 1125   MONOABS 0.9 06/23/2009 1125   EOSABS 0.1 06/23/2009 1125   BASOSABS 0.0 06/23/2009 1125   Lipase     Component Value Date/Time   LIPASE 31 07/09/2019 1640   LIPASE 39 07/09/2019 1640   Hepatic Function Panel     Component Value Date/Time   PROT  07/09/2019 1640    PATIENT IDENTIFICATION ERROR. PLEASE DISREGARD RESULTS. ACCOUNT WILL BE CREDITED.   PROT 7.6 07/09/2019 1640   ALBUMIN 4.3 07/09/2019 1640   AST 20 07/09/2019 1640   ALT 22 07/09/2019 1640   ALKPHOS 54 07/09/2019 1640   BILITOT 1.2 07/09/2019 1640   BILIDIR 0.1 07/09/2019 1640   IBILI 1.1 (H) 07/09/2019 1640     Review of Systems Past Medical History:  Diagnosis Date   . Anal fissure 05/27/2016  . Asthma   . Contraceptive management 01/30/2015  . Hemorrhoids 05/27/2016  . Hypertension   . Left ovarian cyst 09/21/2014  . Menstrual cramps 09/21/2014  . Ovarian cyst, right     Past Surgical History:  Procedure Laterality Date  . ENDOMETRIAL ABLATION N/A 04/12/2018   Procedure: ENDOMETRIAL ABLATION MINERVA;  Surgeon: Jonnie Kind, MD;  Location: [facility];  Service: Gynecology;  Laterality: N/A;  . HYSTEROSCOPY W/D&C N/A 04/12/2018   Procedure: DILATATION  Pollyann Glen;  Surgeon: Jonnie Kind, MD;  Location: [facility];  Service: Gynecology;  Laterality: N/A;  . LAPAROSCOPIC BILATERAL SALPINGECTOMY Bilateral 04/12/2018   Procedure: LAPAROSCOPIC BILATERAL SALPINGECTOMY;  Surgeon: Jonnie Kind, MD;  Location: [facility];  Service: Gynecology;  Laterality: Bilateral;  . WISDOM TOOTH EXTRACTION      Allergies  Allergen Reactions  . Penicillins Hives, Rash and Other (See Comments)    Has patient had a PCN reaction causing immediate rash, facial/tongue/throat swelling, SOB or lightheadedness with hypotension: No Has patient had a PCN reaction causing severe rash involving mucus membranes or skin necrosis: No Has patient had a PCN reaction that required hospitalization: No Has patient had a PCN reaction occurring within the last 10 years: No  If all of the above answers are "NO", then may proceed with Cephalosporin  use.     Current Outpatient Medications on File Prior to Visit  Medication Sig Dispense Refill  . albuterol (PROVENTIL HFA;VENTOLIN HFA) 108 (90 BASE) MCG/ACT inhaler Inhale 2 puffs into the lungs every 6 (six) hours as needed for wheezing or shortness of breath.    . Multiple Vitamin (MULTIVITAMIN) tablet Take 1 tablet by mouth daily.    . pantoprazole (PROTONIX) 40 MG tablet Take 40 mg by mouth daily.    Marland Kitchen amLODipine (NORVASC) 5 MG tablet Take 1 tablet (5 mg total) by mouth daily. (Patient not taking: Reported on 07/11/2019) 30 tablet 3  .  ketorolac (TORADOL) 10 MG tablet Take 1 tablet (10 mg total) by mouth every 6 (six) hours as needed (five day limit postop). (Patient not taking: Reported on 04/25/2018) 20 tablet 0  . traMADol (ULTRAM) 50 MG tablet Take 1 tablet (50 mg total) by mouth every 6 (six) hours as needed for moderate pain or severe pain. (Patient not taking: Reported on 04/25/2018) 12 tablet 0   No current facility-administered medications on file prior to visit.         Objective:   Physical Exam Blood pressure (!) 140/97, pulse 90, temperature 98.5 F (36.9 C), height 5\' 3"  (1.6 m), weight 179 lb 6.4 oz (81.4 kg). Alert and oriented. Skin warm and dry. Oral mucosa is moist.   . Sclera anicteric, conjunctivae is pink. Thyroid not enlarged. No cervical lymphadenopathy. Lungs clear. Heart regular rate and rhythm.  Abdomen is soft. Bowel sounds are positive. No hepatomegaly. No abdominal masses felt. No tenderness.  No edema to lower extremities.          Assessment & Plan:  Rt upper chest pain with nausea usually after meals. Has Cardiology appt in September. Will go ahead and rule out GB disease. HIDA scan. Further recommendations to follow.

## 2019-07-12 ENCOUNTER — Ambulatory Visit (HOSPITAL_COMMUNITY)
Admission: RE | Admit: 2019-07-12 | Discharge: 2019-07-12 | Disposition: A | Discharge status: Home / Self Care | Payer: BC Managed Care – PPO | Source: Ambulatory Visit | Attending: Internal Medicine | Admitting: Internal Medicine

## 2019-07-12 ENCOUNTER — Encounter (HOSPITAL_COMMUNITY): Payer: Self-pay

## 2019-07-12 DIAGNOSIS — R0789 Other chest pain: Secondary | ICD-10-CM | POA: Diagnosis present

## 2019-07-12 MED ORDER — TECHNETIUM TC 99M MEBROFENIN IV KIT
5.0000 | PACK | Freq: Once | INTRAVENOUS | Status: AC | PRN
  Administered 2019-07-12: 5.2 via INTRAVENOUS

## 2019-08-16 ENCOUNTER — Encounter: Payer: Self-pay | Admitting: Cardiovascular Disease

## 2019-08-16 ENCOUNTER — Ambulatory Visit (INDEPENDENT_AMBULATORY_CARE_PROVIDER_SITE_OTHER): Payer: BC Managed Care – PPO | Admitting: Cardiovascular Disease

## 2019-08-16 ENCOUNTER — Other Ambulatory Visit: Payer: Self-pay

## 2019-08-16 VITALS — BP 126/82 | HR 107 | Temp 97.3°F | Ht 63.0 in | Wt 184.0 lb

## 2019-08-16 DIAGNOSIS — R0789 Other chest pain: Secondary | ICD-10-CM

## 2019-08-16 DIAGNOSIS — I10 Essential (primary) hypertension: Secondary | ICD-10-CM | POA: Diagnosis not present

## 2019-08-16 DIAGNOSIS — R42 Dizziness and giddiness: Secondary | ICD-10-CM

## 2019-08-16 NOTE — Progress Notes (Signed)
CARDIOLOGY CONSULT NOTE  Patient ID: AMSI DELAROCA MRN: [id number] DOB/AGE: 1976-03-12 43 y.o.  Admit date: (Not on file) Primary Physician: Sharilyn Sites, MD  Reason for Consultation: Chest pain  HPI: Stephanie Paul is a 43 y.o. female who is being seen today for the evaluation of chest pain at the request of Sharilyn Sites, MD.   Past medical history includes hypertension.  I reviewed notes and records from her PCP.  I personally reviewed the ECG performed on 06/21/2019 which demonstrated sinus rhythm with no ischemic ST segment or T wave abnormalities, nor any arrhythmias.  At the end of June she began experiencing some upper central chest pains radiating to her right shoulder.  Symptoms would often occur after eating.  She was placed on a proton pump inhibitor and symptoms improved significantly.  She currently denies exertional chest pain and dyspnea.  Her primary complaints relate to episodic dizziness.  It occurred once while driving.  She does have a history of seasonal allergies as well as asthma.  She denies headaches and visual changes.  She does have a history of sinus infections.  She had a normal chest x-ray on 07/09/2019.  She underwent a normal HIDA scan on 07/12/2019.  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   Allergies  Allergen Reactions  . Penicillins Hives, Rash and Other (See Comments)    Has patient had a PCN reaction causing immediate rash, facial/tongue/throat swelling, SOB or lightheadedness with hypotension: No Has patient had a PCN reaction causing severe rash involving mucus membranes or skin necrosis: No Has patient had a PCN reaction that required hospitalization: No Has patient had a PCN reaction occurring within the last 10 years: No  If all of the above answers are "NO", then may proceed with Cephalosporin use.     Current Outpatient Medications  Medication Sig Dispense  Refill  . albuterol (PROVENTIL HFA;VENTOLIN HFA) 108 (90 BASE) MCG/ACT inhaler Inhale 2 puffs into the lungs every 6 (six) hours as needed for wheezing or shortness of breath.    Marland Kitchen BREO ELLIPTA 200-25 MCG/INH AEPB INHALE 1 PUFF BY MOUTH ONCE DAILY    . Multiple Vitamin (MULTIVITAMIN) tablet Take 1 tablet by mouth daily.    Marland Kitchen olmesartan-hydrochlorothiazide (BENICAR HCT) 40-25 MG tablet Take 1 tablet by mouth daily.    . pantoprazole (PROTONIX) 40 MG tablet Take 40 mg by mouth daily.     No current facility-administered medications for this visit.     Past Medical History:  Diagnosis Date  . Anal fissure 05/27/2016  . Asthma   . Contraceptive management 01/30/2015  . Hemorrhoids 05/27/2016  . Hypertension   . Left ovarian cyst 09/21/2014  . Menstrual cramps 09/21/2014  . Ovarian cyst, right     Past Surgical History:  Procedure Laterality Date  . ENDOMETRIAL ABLATION N/A 04/12/2018   Procedure: ENDOMETRIAL ABLATION MINERVA;  Surgeon: Jonnie Kind, MD;  Location: [facility];  Service: Gynecology;  Laterality: N/A;  . HYSTEROSCOPY W/D&C N/A 04/12/2018   Procedure: DILATATION  Pollyann Glen;  Surgeon: Jonnie Kind, MD;  Location: [facility];  Service: Gynecology;  Laterality: N/A;  . LAPAROSCOPIC BILATERAL SALPINGECTOMY Bilateral 04/12/2018   Procedure: LAPAROSCOPIC BILATERAL SALPINGECTOMY;  Surgeon: Jonnie Kind, MD;  Location: [facility];  Service: Gynecology;  Laterality: Bilateral;  . WISDOM TOOTH EXTRACTION      Social History   Socioeconomic History  . Marital status:  Married    Spouse name: Not on file  . Number of children: Not on file  . Years of education: Not on file  . Highest education level: Not on file  Occupational History  . Not on file  Social Needs  . Financial resource strain: Not on file  . Food insecurity    Worry: Not on file    Inability: Not on file  . Transportation needs    Medical: Not on file    Non-medical: Not on file  Tobacco Use  . Smoking  status: Former Smoker    Packs/day: 1.00    Years: 10.00    Pack years: 10.00    Types: Cigarettes    Quit date: 04/06/2008    Years since quitting: 11.3  . Smokeless tobacco: Never Used  . Tobacco comment: quit 2010 after smoking 10 yrs  Substance and Sexual Activity  . Alcohol use: Yes    Alcohol/week: 2.0 standard drinks    Types: 2 Cans of beer per week    Comment: occ  . Drug use: No  . Sexual activity: Yes    Birth control/protection: None  Lifestyle  . Physical activity    Days per week: Not on file    Minutes per session: Not on file  . Stress: Not on file  Relationships  . Social Herbalist on phone: Not on file    Gets together: Not on file    Attends religious service: Not on file    Active member of club or organization: Not on file    Attends meetings of clubs or organizations: Not on file    Relationship status: Not on file  . Intimate partner violence    Fear of current or ex partner: Not on file    Emotionally abused: Not on file    Physically abused: Not on file    Forced sexual activity: Not on file  Other Topics Concern  . Not on file  Social History Narrative  . Not on file     Family history: Mother has a pacemaker.  Current Meds  Medication Sig  . albuterol (PROVENTIL HFA;VENTOLIN HFA) 108 (90 BASE) MCG/ACT inhaler Inhale 2 puffs into the lungs every 6 (six) hours as needed for wheezing or shortness of breath.  Marland Kitchen BREO ELLIPTA 200-25 MCG/INH AEPB INHALE 1 PUFF BY MOUTH ONCE DAILY  . Multiple Vitamin (MULTIVITAMIN) tablet Take 1 tablet by mouth daily.  Marland Kitchen olmesartan-hydrochlorothiazide (BENICAR HCT) 40-25 MG tablet Take 1 tablet by mouth daily.  . pantoprazole (PROTONIX) 40 MG tablet Take 40 mg by mouth daily.      Review of systems complete and found to be negative unless listed above in HPI    Physical exam Blood pressure 126/82, pulse (!) 107, temperature (!) 97.3 F (36.3 C), height 5\' 3"  (1.6 m), weight 184 lb (83.5 kg).  General: NAD Neck: No JVD, no thyromegaly or thyroid nodule.  Lungs: Clear to auscultation bilaterally with normal respiratory effort. CV: Nondisplaced PMI. Regular rate and rhythm, normal S1/S2, no S3/S4, no murmur.  No peripheral edema.  No carotid bruit.    Abdomen: Soft, nontender, no distention.  Skin: Intact without lesions or rashes.  Neurologic: Alert and oriented x 3.  Psych: Normal affect. Extremities: No clubbing or cyanosis.  HEENT: Normal.   ECG: Most recent ECG reviewed.   Labs: Lab Results  Component Value Date/Time   K 4.1 07/09/2019 02:54 PM   BUN 12 07/09/2019 02:54 PM  CREATININE 0.80 07/09/2019 02:54 PM   CREATININE 0.94 03/29/2013 09:52 AM   ALT 22 07/09/2019 04:40 PM   TSH 1.876 03/29/2013 09:52 AM   HGB 13.0 07/09/2019 02:54 PM     Lipids: Lab Results  Component Value Date/Time   LDLCALC 88 03/29/2013 09:52 AM   CHOL 150 03/29/2013 09:52 AM   TRIG 107 03/29/2013 09:52 AM   HDL 41 03/29/2013 09:52 AM        ASSESSMENT AND PLAN:   1.  Atypical chest pain: Symptoms have entirely resolved after the institution of Protonix.  It appears the symptoms were GI in etiology as they occurred primarily after meals.  No cardiac testing is indicated at this time.  2.  Episodic dizziness: Symptoms do not occur with rotation of the head and thus unlikely to be benign positional vertigo.  However, an ENT evaluation may be helpful.  She does have a history of sinus infections and seasonal allergies.  3.  Hypertension: Blood pressure is normal.  No changes to therapy.    Disposition: Follow up prn  Signed: Kate Sable, M.D., F.A.C.C.  08/16/2019, 8:41 AM

## 2019-08-16 NOTE — Patient Instructions (Addendum)
Medication Instructions:  Your physician recommends that you continue on your current medications as directed. Please refer to the Current Medication list given to you today.   Labwork: NONE  Testing/Procedures: NONE  Follow-Up: Your physician recommends that you schedule a follow-up appointment in: AS NEEDED      Any Other Special Instructions Will Be Listed Below (If Applicable).     If you need a refill on your cardiac medications before your next appointment, please call your pharmacy.   

## 2019-11-07 DIAGNOSIS — U071 COVID-19: Secondary | ICD-10-CM | POA: Diagnosis not present

## 2019-11-08 ENCOUNTER — Other Ambulatory Visit: Payer: Self-pay

## 2019-11-08 DIAGNOSIS — Z20822 Contact with and (suspected) exposure to covid-19: Secondary | ICD-10-CM

## 2019-11-09 LAB — NOVEL CORONAVIRUS, NAA: SARS-CoV-2, NAA: NOT DETECTED

## 2019-12-11 DIAGNOSIS — I10 Essential (primary) hypertension: Secondary | ICD-10-CM | POA: Diagnosis not present

## 2019-12-11 DIAGNOSIS — Z681 Body mass index (BMI) 19 or less, adult: Secondary | ICD-10-CM | POA: Diagnosis not present

## 2019-12-11 DIAGNOSIS — R000 Tachycardia, unspecified: Secondary | ICD-10-CM | POA: Diagnosis not present

## 2020-01-01 ENCOUNTER — Encounter (INDEPENDENT_AMBULATORY_CARE_PROVIDER_SITE_OTHER): Payer: Self-pay

## 2020-01-11 ENCOUNTER — Other Ambulatory Visit: Payer: Self-pay

## 2020-01-11 ENCOUNTER — Encounter (INDEPENDENT_AMBULATORY_CARE_PROVIDER_SITE_OTHER): Payer: Self-pay | Admitting: Gastroenterology

## 2020-01-11 ENCOUNTER — Ambulatory Visit (INDEPENDENT_AMBULATORY_CARE_PROVIDER_SITE_OTHER): Payer: BC Managed Care – PPO | Admitting: Gastroenterology

## 2020-01-11 VITALS — BP 135/76 | HR 76 | Temp 97.8°F | Ht 64.0 in | Wt 191.4 lb

## 2020-01-11 DIAGNOSIS — K5900 Constipation, unspecified: Secondary | ICD-10-CM | POA: Diagnosis not present

## 2020-01-11 DIAGNOSIS — K219 Gastro-esophageal reflux disease without esophagitis: Secondary | ICD-10-CM | POA: Diagnosis not present

## 2020-01-11 NOTE — Patient Instructions (Addendum)
Try miralax daily - can start with a half dose daily - increase as needed. Can also do every other day. Want to keep stools soft and frequent to avoid straining.   Continue Protonix - take 30 minutes before meal  If taking nsaid products (ibuprofen, advil, etc take w/ food)  Please notify me if symptoms continue or worsen.

## 2020-01-11 NOTE — Progress Notes (Signed)
Patient profile: Stephanie Paul is a 44 y.o. female seen for evaluation of chest pain. Last seen in clinic on 07/11/19 for CP - at that time had work up w/ HIDA which was negative.  History of Present Illness: Stephanie Paul is seen today for evaluation of continued symptoms, previously described as chest pain.  Today describes more of an uneasy feeling, more so located in epigastric area.  Sometimes feels heart flutter with symptoms but started beta-blocker for tachycardia and this helped fluttering.  She denies any specific abdominal pain nausea or vomiting with the episodes.  The uneasy feeling last about 20 minutes and can cause her to stop what she is doing.  Wonders if it may be anxiety related as well.  She has identified triggers of the episodes as either having a very large meal such as [place] out to eat or not having BM in several days.  She previously had significant GERD symptoms but these have completely resolved with 40 mg of pantoprazole once a day.  She has a history of constipation, at one point was having 3 to 4 days between stools.  She started Metamucil and this helped but is causing bloating.  She is trying to increase dietary fiber.  She denies any rectal bleeding or melena.  Non-smoker.  Few beers 1-2 times a week.  Previously used alcohol heavily but decreased when her menstrual cycles stopped  Wt Readings from Last 3 Encounters:  01/11/20 191 lb 6.4 oz (86.8 kg)  08/16/19 184 lb (83.5 kg)  07/11/19 179 lb 6.4 oz (81.4 kg)     Last Colonoscopy: none prior   Last Endoscopy: none prior    Past Medical History:  Past Medical History:  Diagnosis Date  . Anal fissure 05/27/2016  . Asthma   . Contraceptive management 01/30/2015  . Hemorrhoids 05/27/2016  . Hypertension   . Left ovarian cyst 09/21/2014  . Menstrual cramps 09/21/2014  . Ovarian cyst, right     Problem List: Patient Active Problem List   Diagnosis Date Noted  . Chest pain 07/11/2019  .  Encounter for well woman exam with routine gynecological exam 09/20/2017  . Dysmenorrhea 09/20/2017  . Hemorrhoids 05/27/2016  . Anal fissure 05/27/2016  . Contraceptive management 01/30/2015  . Left ovarian cyst 09/21/2014  . Menstrual cramps 09/21/2014  . GERD (gastroesophageal reflux disease) 04/05/2013  . right ovarian cyst 03/28/2013    Past Surgical History: Past Surgical History:  Procedure Laterality Date  . ENDOMETRIAL ABLATION N/A 04/12/2018   Procedure: ENDOMETRIAL ABLATION MINERVA;  Surgeon: Jonnie Kind, MD;  Location: [facility];  Service: Gynecology;  Laterality: N/A;  . HYSTEROSCOPY WITH D & C N/A 04/12/2018   Procedure: DILATATION  /HYSTEROSCOPY;  Surgeon: Jonnie Kind, MD;  Location: [facility];  Service: Gynecology;  Laterality: N/A;  . LAPAROSCOPIC BILATERAL SALPINGECTOMY Bilateral 04/12/2018   Procedure: LAPAROSCOPIC BILATERAL SALPINGECTOMY;  Surgeon: Jonnie Kind, MD;  Location: [facility];  Service: Gynecology;  Laterality: Bilateral;  . WISDOM TOOTH EXTRACTION      Allergies: Allergies  Allergen Reactions  . Penicillins Hives, Rash and Other (See Comments)    Has patient had a PCN reaction causing immediate rash, facial/tongue/throat swelling, SOB or lightheadedness with hypotension: No Has patient had a PCN reaction causing severe rash involving mucus membranes or skin necrosis: No Has patient had a PCN reaction that required hospitalization: No Has patient had a PCN reaction occurring within the last 10 years: No  If all of  the above answers are "NO", then may proceed with Cephalosporin use.       Home Medications:  Current Outpatient Medications:  .  albuterol (PROVENTIL HFA;VENTOLIN HFA) 108 (90 BASE) MCG/ACT inhaler, Inhale 2 puffs into the lungs every 6 (six) hours as needed for wheezing or shortness of breath., Disp: , Rfl:  .  cetirizine (ZYRTEC) 10 MG tablet, Take 10 mg by mouth daily., Disp: , Rfl:  .  Fluticasone Furoate-Vilanterol (BREO ELLIPTA  IN), Inhale into the lungs as needed., Disp: , Rfl:  .  METOPROLOL SUCCINATE PO, Take 50 mg by mouth 2 (two) times daily., Disp: , Rfl:  .  Multiple Vitamin (MULTIVITAMIN) tablet, Take 1 tablet by mouth daily., Disp: , Rfl:  .  pantoprazole (PROTONIX) 40 MG tablet, Take 40 mg by mouth daily., Disp: , Rfl:    Family History: family history includes Cancer in her paternal grandfather; Diabetes in her father and maternal grandfather; Gallbladder disease in her father; Hyperlipidemia in her paternal grandmother; Hypertension in her mother and paternal grandmother.    Social History:   reports that she quit smoking about 11 years ago. Her smoking use included cigarettes. She has a 10.00 pack-year smoking history. She has never used smokeless tobacco. She reports current alcohol use of about 2.0 standard drinks of alcohol per week. She reports that she does not use drugs.   Review of Systems: Constitutional: Denies weight loss/weight gain  Eyes: No changes in vision. ENT: No oral lesions, sore throat.  GI: see HPI.  Heme/Lymph: No easy bruising.  CV: No chest pain.  GU: No hematuria.  Integumentary: No rashes.  Neuro: No headaches.  Psych: No depression/anxiety.  Endocrine: No heat/cold intolerance.  Allergic/Immunologic: No urticaria.  Resp: No cough, SOB.  Musculoskeletal: No joint swelling.    Physical Examination: BP 135/76 (BP Location: Right Arm, Patient Position: Sitting, Cuff Size: Large)   Pulse 76   Temp 97.8 F (36.6 C) (Temporal)   Ht 5\' 4"  (1.626 m)   Wt 191 lb 6.4 oz (86.8 kg)   BMI 32.85 kg/m  Gen: NAD, alert and oriented x 4 HEENT: PEERLA, EOMI, Neck: supple, no JVD Chest: CTA bilaterally, no wheezes, crackles, or other adventitious sounds CV: RRR, no m/g/c/r Abd: soft, NT, ND, +BS in all four quadrants; no HSM, guarding, ridigity, or rebound tenderness Ext: no edema, well perfused with 2+ pulses, Skin: no rash or lesions noted on observed skin Lymph: no noted  LAD  Data Reviewed:  HIDA Scan - 07/12/19-IMPRESSION: Patent biliary tree with normal gallbladder ejection fraction of 58% following fatty meal stimulation. Patient reported mild back discomfort and some nausea following Ensure ingestion.  Cardiology notes reviewed   Labs July 2020-normal lipase, normal LFTs except indirect bilirubin 1.1, normal troponin, normal CBC  Assessment/Plan: Ms. Labella is a 44 y.o. female   1.  GERD-asymptomatic on Protonix 40 mg.  She reports severe symptoms prior.  We discussed diet modifications that she is compliant with.  She is going to start a diet to try and lose weight which may help.  Reviewed NSAID avoidance.  2.  Constipation-improved with Metamucil but is having significant bloating & gas.  We review starting MiraLAX half dose and titrating for soft stools daily.  3.  Dyspepsia-more described as episodes of "uneasiness" that can be clearly triggered by constipation or large meals.  Has had cardiology and gallbladder work-up negative.  Suspect these may also have a component of anxiety.  Given her episodes have clear triggers  will focus on avoidance of triggers..  She is to notify me if these continue and would consider endoscopy.  Alarm symptoms tp contact me sooner with reviewed. Overall much less often than last summer when above eval was completed   She will follow up as needed, to contact us if not doing better  25 min total pt care, >50% face to face    I personally performed the service, non-incident to. (WP)  Laurine Blazer, [facility]

## 2020-02-04 ENCOUNTER — Other Ambulatory Visit: Payer: Self-pay | Admitting: Adult Health

## 2020-02-26 ENCOUNTER — Other Ambulatory Visit (HOSPITAL_COMMUNITY): Payer: Self-pay | Admitting: Family Medicine

## 2020-02-26 DIAGNOSIS — Z1231 Encounter for screening mammogram for malignant neoplasm of breast: Secondary | ICD-10-CM

## 2020-03-06 ENCOUNTER — Other Ambulatory Visit: Payer: Self-pay

## 2020-03-06 ENCOUNTER — Ambulatory Visit (HOSPITAL_COMMUNITY)
Admission: RE | Admit: 2020-03-06 | Discharge: 2020-03-06 | Disposition: A | Discharge status: Home / Self Care | Payer: BC Managed Care – PPO | Source: Ambulatory Visit | Attending: Family Medicine | Admitting: Family Medicine

## 2020-03-06 DIAGNOSIS — Z1231 Encounter for screening mammogram for malignant neoplasm of breast: Secondary | ICD-10-CM | POA: Insufficient documentation

## 2020-11-20 DIAGNOSIS — Z0001 Encounter for general adult medical examination with abnormal findings: Secondary | ICD-10-CM | POA: Diagnosis not present

## 2020-11-20 DIAGNOSIS — Z1331 Encounter for screening for depression: Secondary | ICD-10-CM | POA: Diagnosis not present

## 2020-11-20 DIAGNOSIS — E6609 Other obesity due to excess calories: Secondary | ICD-10-CM | POA: Diagnosis not present

## 2020-11-20 DIAGNOSIS — L989 Disorder of the skin and subcutaneous tissue, unspecified: Secondary | ICD-10-CM | POA: Diagnosis not present

## 2020-11-20 DIAGNOSIS — Z6832 Body mass index (BMI) 32.0-32.9, adult: Secondary | ICD-10-CM | POA: Diagnosis not present

## 2020-12-25 DIAGNOSIS — R21 Rash and other nonspecific skin eruption: Secondary | ICD-10-CM | POA: Diagnosis not present

## 2020-12-25 DIAGNOSIS — L814 Other melanin hyperpigmentation: Secondary | ICD-10-CM | POA: Diagnosis not present

## 2021-01-17 DIAGNOSIS — I10 Essential (primary) hypertension: Secondary | ICD-10-CM | POA: Diagnosis not present

## 2021-01-17 DIAGNOSIS — Z0000 Encounter for general adult medical examination without abnormal findings: Secondary | ICD-10-CM | POA: Diagnosis not present

## 2021-01-23 DIAGNOSIS — I10 Essential (primary) hypertension: Secondary | ICD-10-CM | POA: Diagnosis not present

## 2021-01-23 DIAGNOSIS — R519 Headache, unspecified: Secondary | ICD-10-CM | POA: Diagnosis not present

## 2021-01-23 DIAGNOSIS — Z6831 Body mass index (BMI) 31.0-31.9, adult: Secondary | ICD-10-CM | POA: Diagnosis not present

## 2021-01-23 DIAGNOSIS — E7849 Other hyperlipidemia: Secondary | ICD-10-CM | POA: Diagnosis not present

## 2021-02-24 ENCOUNTER — Other Ambulatory Visit (HOSPITAL_COMMUNITY): Payer: Self-pay | Admitting: Family Medicine

## 2021-02-24 DIAGNOSIS — Z1231 Encounter for screening mammogram for malignant neoplasm of breast: Secondary | ICD-10-CM

## 2021-03-03 ENCOUNTER — Ambulatory Visit (HOSPITAL_COMMUNITY): Payer: BC Managed Care – PPO

## 2021-03-10 ENCOUNTER — Ambulatory Visit (HOSPITAL_COMMUNITY)
Admission: RE | Admit: 2021-03-10 | Discharge: 2021-03-10 | Disposition: A | Discharge status: Home / Self Care | Payer: BC Managed Care – PPO | Source: Ambulatory Visit | Attending: Family Medicine | Admitting: Family Medicine

## 2021-03-10 DIAGNOSIS — Z1231 Encounter for screening mammogram for malignant neoplasm of breast: Secondary | ICD-10-CM | POA: Diagnosis not present

## 2021-05-15 ENCOUNTER — Other Ambulatory Visit: Payer: Self-pay

## 2021-05-15 ENCOUNTER — Encounter: Payer: Self-pay | Admitting: Adult Health

## 2021-05-15 ENCOUNTER — Ambulatory Visit (INDEPENDENT_AMBULATORY_CARE_PROVIDER_SITE_OTHER): Payer: BC Managed Care – PPO | Admitting: Adult Health

## 2021-05-15 ENCOUNTER — Other Ambulatory Visit: Payer: Self-pay | Admitting: Adult Health

## 2021-05-15 ENCOUNTER — Encounter (INDEPENDENT_AMBULATORY_CARE_PROVIDER_SITE_OTHER): Payer: Self-pay | Admitting: *Deleted

## 2021-05-15 ENCOUNTER — Other Ambulatory Visit (HOSPITAL_COMMUNITY)
Admission: RE | Admit: 2021-05-15 | Discharge: 2021-05-15 | Disposition: A | Discharge status: Home / Self Care | Payer: BC Managed Care – PPO | Source: Ambulatory Visit | Attending: Adult Health | Admitting: Adult Health

## 2021-05-15 VITALS — BP 120/80 | HR 77 | Ht 64.0 in | Wt 185.6 lb

## 2021-05-15 DIAGNOSIS — K649 Unspecified hemorrhoids: Secondary | ICD-10-CM

## 2021-05-15 DIAGNOSIS — R195 Other fecal abnormalities: Secondary | ICD-10-CM | POA: Insufficient documentation

## 2021-05-15 DIAGNOSIS — Z01419 Encounter for gynecological examination (general) (routine) without abnormal findings: Secondary | ICD-10-CM | POA: Diagnosis not present

## 2021-05-15 DIAGNOSIS — Z1212 Encounter for screening for malignant neoplasm of rectum: Secondary | ICD-10-CM

## 2021-05-15 DIAGNOSIS — Z1211 Encounter for screening for malignant neoplasm of colon: Secondary | ICD-10-CM

## 2021-05-15 LAB — HEMOCCULT GUIAC POC 1CARD (OFFICE): Fecal Occult Blood, POC: POSITIVE — AB

## 2021-05-15 NOTE — Progress Notes (Signed)
Patient ID: Stephanie Paul, female   DOB: 11/08/1976, 45 y.o.   MRN: [id number] History of Present Illness: Stephanie Paul is a 45 year old white female, married, G1P1 in for a well woman gyn exam and pap.She is Freight forwarder at [employer].  PCP is Dr Hilma Favors.   Current Medications, Allergies, Past Medical History, Past Surgical History, Family History and Social History were reviewed in [employer] record.     Review of Systems: Patient denies any headaches, hearing loss, fatigue, blurred vision, shortness of breath, chest pain, abdominal pain, problems with bowel movements,(has to take stool softener) urination, or intercourse. No joint pain or mood swings.    Physical Exam:BP 120/80 (BP Location: Right Arm, Patient Position: Sitting, Cuff Size: Normal)   Pulse 77   Ht 5\' 4"  (1.626 m)   Wt 185 lb 9.6 oz (84.2 kg)   BMI 31.86 kg/m  General:  Well developed, well nourished, no acute distress Skin:  Warm and dry Neck:  Midline trachea, normal thyroid, good ROM, no lymphadenopathy Lungs; Clear to auscultation bilaterally Breast:  No dominant palpable mass, retraction, or nipple discharge Cardiovascular: Regular rate and rhythm Abdomen:  Soft, non tender, no hepatosplenomegaly Pelvic:  External genitalia is normal in appearance, no lesions.  The vagina is normal in appearance. Urethra has no lesions or masses. The cervix is bulbous.Pap with HR HPV genotyping performed.  Uterus is felt to be normal size, shape, and contour.  No adnexal masses or tenderness noted.Bladder is non tender, no masses felt. Rectal: Good sphincter tone, no polyps, +hemorrhoids felt.  Hemoccult positive. Extremities/musculoskeletal:  No swelling or varicosities noted, no clubbing or cyanosis Psych:  No mood changes, alert and cooperative,seems happy AA is 6 Fall risk is low Depression screen [facility] 2/9 05/15/2021 09/20/2017  Decreased Interest 1 0  Down, Depressed, Hopeless 0 0  PHQ - 2 Score 1 0  Altered  sleeping 1 -  Tired, decreased energy 1 -  Change in appetite 0 -  Feeling bad or failure about yourself  0 -  Trouble concentrating 0 -  Moving slowly or fidgety/restless 0 -  Suicidal thoughts 0 -  PHQ-9 Score 3 -   GAD 7 : Generalized Anxiety Score 05/15/2021  Nervous, Anxious, on Edge 1  Control/stop worrying 0  Worry too much - different things 1  Trouble relaxing 1  Restless 0  Easily annoyed or irritable 1  Afraid - awful might happen 1  Total GAD 7 Score 5    Upstream - 05/15/21 1044      Pregnancy Intention Screening   Does the patient want to become pregnant in the next year? No    Does the patient's partner want to become pregnant in the next year? No    Would the patient like to discuss contraceptive options today? No      Contraception Wrap Up   Current Method Female Sterilization    End Method Female Sterilization    Contraception Counseling Provided No         Examination chaperoned by [employer] RN  Impression and Plan: 1. Encounter for gynecological examination with Papanicolaou smear of cervix Pap sent Physical in 1 year Pap in 3 if normal Mammogram yearly  2. Encounter for screening fecal occult blood testing  3. Screening for colorectal cancer Referred to Dr Laural Golden for colonoscopy   4. Fecal occult blood test positive Sent 3 hemoccult cards home to do   5. Hemorrhoids, unspecified hemorrhoid type

## 2021-05-16 ENCOUNTER — Other Ambulatory Visit: Payer: Self-pay | Admitting: Adult Health

## 2021-05-19 LAB — CYTOLOGY - PAP
Comment: NEGATIVE
Diagnosis: NEGATIVE
High risk HPV: NEGATIVE

## 2021-05-20 ENCOUNTER — Other Ambulatory Visit (INDEPENDENT_AMBULATORY_CARE_PROVIDER_SITE_OTHER): Payer: BC Managed Care – PPO

## 2021-05-20 DIAGNOSIS — Z1211 Encounter for screening for malignant neoplasm of colon: Secondary | ICD-10-CM

## 2021-05-20 LAB — HEMOCCULT GUIAC POC 1CARD (OFFICE)
Card #2 Fecal Occult Blod, POC: NEGATIVE
Card #3 Fecal Occult Blood, POC: NEGATIVE
Fecal Occult Blood, POC: NEGATIVE

## 2021-05-20 NOTE — Progress Notes (Signed)
Chart reviewed for nurse visit. Agree with plan of care.  Derrek Monaco A, NP 05/20/2021 5:00 PM

## 2021-05-20 NOTE — Progress Notes (Signed)
Hemoccult cards returned. Negative x 3. Derrek Monaco informed. Pt aware.

## 2021-06-02 ENCOUNTER — Telehealth (INDEPENDENT_AMBULATORY_CARE_PROVIDER_SITE_OTHER): Payer: Self-pay

## 2021-06-02 ENCOUNTER — Other Ambulatory Visit (INDEPENDENT_AMBULATORY_CARE_PROVIDER_SITE_OTHER): Payer: Self-pay

## 2021-06-02 ENCOUNTER — Encounter (INDEPENDENT_AMBULATORY_CARE_PROVIDER_SITE_OTHER): Payer: Self-pay

## 2021-06-02 DIAGNOSIS — Z1211 Encounter for screening for malignant neoplasm of colon: Secondary | ICD-10-CM

## 2021-06-02 MED ORDER — PEG 3350-KCL-NA BICARB-NACL 420 G PO SOLR: 4000.0000 mL | ORAL | 0 refills | Status: DC

## 2021-06-02 NOTE — Telephone Encounter (Signed)
Referring MD/PCP: Caprice Beaver  Procedure: Screening  Reason/Indication:  Screening  Has patient had this procedure before?  Yes/2009  If so, when, by whom and where?    Is there a family history of colon cancer?  no  Who?  What age when diagnosed?    Is patient diabetic? If yes, Type 1 or Type 2   Yes, Type 2      Does patient have prosthetic heart valve or mechanical valve?  no  Do you have a pacemaker/defibrillator?  no  Has patient ever had endocarditis/atrial fibrillation? Yes, Atrial Fibrillation  Does patient use oxygen? no  Has patient had joint replacement within last 12 months?  no  Is patient constipated or do they take laxatives? no  Does patient have a history of alcohol/drug use?  no  Have you had a stroke/heart attack last 6 mths? no  Do you take medicine for weight loss?  no  For female patients,: do you still have your menstrual cycle? N/A  Is patient on blood thinner such as Coumadin, Plavix and/or Aspirin? yes  Medications: metoprolol 50 mg daily, fenofibrate 160 mg daily, hctz 25 mg daily, metformin 500 mg bid, glipizide 5 mg bid, pravastatin 20 mg daily, fish oil 4 daily, hydrocodone, acet 10/325 mg tid, gabapentin 600 mg tid, duexis 800/26.2 mg tid, asa 81 mg daily, allergra 180 mg daily, mvi gummies bid, Vit c 1000 mg bid, Vit D 3  125 mg qid , Zinc 50 mg daily, turmeric 500 mg daily  Allergies: codeine  Medication Adjustment per Dr Rehman/Dr Jenetta Downer no metformin or Glipizide the evening prior or the procedure or the morning of the procedure  Procedure date & time: 07/02/21 10:00

## 2021-06-02 NOTE — Telephone Encounter (Signed)
Stephanie Paul, CMA  

## 2021-06-02 NOTE — Telephone Encounter (Signed)
Referring MD/PCP: Hilma Favors  Procedure: Tcs  Reason/Indication:  Screening  Has patient had this procedure before?  no  If so, when, by whom and where?    Is there a family history of colon cancer?  no  Who?  What age when diagnosed?    Is patient diabetic? If yes, Type 1 or Type 2   no      Does patient have prosthetic heart valve or mechanical valve?  no  Do you have a pacemaker/defibrillator?  no  Has patient ever had endocarditis/atrial fibrillation? no  Does patient use oxygen? no  Has patient had joint replacement within last 12 months?  no  Is patient constipated or do they take laxatives? yes  Does patient have a history of alcohol/drug use?  no  Have you had a stroke/heart attack last 6 mths? no  Do you take medicine for weight loss?  no  For female patients,: do you still have your menstrual cycle? no  Is patient on blood thinner such as Coumadin, Plavix and/or Aspirin? no  Medications: Metoprolol 50 mg bid, pantoprazole 40 mg daily, Olmesartan 40/25 daily, allegra 180 mg daily  Allergies: Pcn  Medication Adjustment per Dr Rehman/Dr Jenetta Downer none  Procedure date & time: 06/25/21 11:15

## 2021-06-02 NOTE — Telephone Encounter (Signed)
Ok to schedule.  Thanks,  Dagan Heinz Castaneda Mayorga, MD Gastroenterology and Hepatology [facility]  

## 2021-06-02 NOTE — Telephone Encounter (Signed)
Ok to schedule.  Thanks,  Alder Murri Castaneda Mayorga, MD Gastroenterology and Hepatology [facility]  

## 2021-06-23 ENCOUNTER — Other Ambulatory Visit (HOSPITAL_COMMUNITY)
Admission: RE | Admit: 2021-06-23 | Discharge: 2021-06-23 | Disposition: A | Discharge status: Home / Self Care | Payer: BC Managed Care – PPO | Source: Ambulatory Visit | Attending: Anesthesiology | Admitting: Anesthesiology

## 2021-06-23 DIAGNOSIS — Z880 Allergy status to penicillin: Secondary | ICD-10-CM | POA: Diagnosis not present

## 2021-06-23 DIAGNOSIS — K648 Other hemorrhoids: Secondary | ICD-10-CM | POA: Diagnosis not present

## 2021-06-23 DIAGNOSIS — Z9079 Acquired absence of other genital organ(s): Secondary | ICD-10-CM | POA: Diagnosis not present

## 2021-06-23 DIAGNOSIS — Z87891 Personal history of nicotine dependence: Secondary | ICD-10-CM | POA: Diagnosis not present

## 2021-06-23 DIAGNOSIS — Z79899 Other long term (current) drug therapy: Secondary | ICD-10-CM | POA: Diagnosis not present

## 2021-06-23 DIAGNOSIS — Z1211 Encounter for screening for malignant neoplasm of colon: Secondary | ICD-10-CM | POA: Diagnosis not present

## 2021-06-23 DIAGNOSIS — D123 Benign neoplasm of transverse colon: Secondary | ICD-10-CM | POA: Diagnosis not present

## 2021-06-23 LAB — BASIC METABOLIC PANEL
Anion gap: 8 (ref 5–15)
BUN: 16 mg/dL (ref 6–20)
CO2: 27 mmol/L (ref 22–32)
Calcium: 8.9 mg/dL (ref 8.9–10.3)
Chloride: 102 mmol/L (ref 98–111)
Creatinine, Ser: 0.91 mg/dL (ref 0.44–1.00)
GFR, Estimated: >60 mL/min (ref >60)
Glucose, Bld: 128 mg/dL — ABNORMAL HIGH (ref 70–99)
Potassium: 3.7 mmol/L (ref 3.5–5.1)
Sodium: 137 mmol/L (ref 135–145)

## 2021-06-23 LAB — PREGNANCY, URINE: Preg Test, Ur: NEGATIVE

## 2021-06-25 ENCOUNTER — Ambulatory Visit (HOSPITAL_COMMUNITY)
Admission: RE | Admit: 2021-06-25 | Discharge: 2021-06-25 | Disposition: A | Discharge status: Home / Self Care | Payer: BC Managed Care – PPO | Attending: Gastroenterology | Admitting: Gastroenterology

## 2021-06-25 ENCOUNTER — Encounter (HOSPITAL_COMMUNITY)
Admission: RE | Disposition: A | Discharge status: Home / Self Care | Payer: Self-pay | Source: Home / Self Care | Attending: Gastroenterology

## 2021-06-25 ENCOUNTER — Encounter (HOSPITAL_COMMUNITY): Payer: Self-pay | Admitting: Gastroenterology

## 2021-06-25 ENCOUNTER — Ambulatory Visit (HOSPITAL_COMMUNITY): Payer: BC Managed Care – PPO | Admitting: Anesthesiology

## 2021-06-25 ENCOUNTER — Other Ambulatory Visit: Payer: Self-pay

## 2021-06-25 DIAGNOSIS — Z1211 Encounter for screening for malignant neoplasm of colon: Secondary | ICD-10-CM | POA: Insufficient documentation

## 2021-06-25 DIAGNOSIS — D125 Benign neoplasm of sigmoid colon: Secondary | ICD-10-CM

## 2021-06-25 DIAGNOSIS — Z79899 Other long term (current) drug therapy: Secondary | ICD-10-CM | POA: Insufficient documentation

## 2021-06-25 DIAGNOSIS — K648 Other hemorrhoids: Secondary | ICD-10-CM

## 2021-06-25 DIAGNOSIS — J45909 Unspecified asthma, uncomplicated: Secondary | ICD-10-CM | POA: Diagnosis not present

## 2021-06-25 DIAGNOSIS — K6389 Other specified diseases of intestine: Secondary | ICD-10-CM

## 2021-06-25 DIAGNOSIS — Z9079 Acquired absence of other genital organ(s): Secondary | ICD-10-CM | POA: Diagnosis not present

## 2021-06-25 DIAGNOSIS — Z880 Allergy status to penicillin: Secondary | ICD-10-CM | POA: Diagnosis not present

## 2021-06-25 DIAGNOSIS — D123 Benign neoplasm of transverse colon: Secondary | ICD-10-CM | POA: Diagnosis not present

## 2021-06-25 DIAGNOSIS — Z87891 Personal history of nicotine dependence: Secondary | ICD-10-CM | POA: Insufficient documentation

## 2021-06-25 DIAGNOSIS — K635 Polyp of colon: Secondary | ICD-10-CM | POA: Diagnosis not present

## 2021-06-25 HISTORY — PX: COLONOSCOPY WITH PROPOFOL: SHX5780

## 2021-06-25 HISTORY — PX: POLYPECTOMY: SHX5525

## 2021-06-25 HISTORY — DX: Cardiac arrhythmia, unspecified: I49.9

## 2021-06-25 LAB — HM COLONOSCOPY

## 2021-06-25 SURGERY — COLONOSCOPY WITH PROPOFOL
Anesthesia: General

## 2021-06-25 MED ORDER — LIDOCAINE HCL (CARDIAC) PF 100 MG/5ML IV SOSY
PREFILLED_SYRINGE | INTRAVENOUS | Status: DC | PRN
  Administered 2021-06-25: 80 mg via INTRAVENOUS

## 2021-06-25 MED ORDER — PROPOFOL 10 MG/ML IV BOLUS
INTRAVENOUS | Status: DC | PRN
  Administered 2021-06-25: 100 mg via INTRAVENOUS
  Administered 2021-06-25: 125 ug/kg/min via INTRAVENOUS

## 2021-06-25 MED ORDER — LACTATED RINGERS IV SOLN
INTRAVENOUS | Status: DC
  Administered 2021-06-25: 1000 mL via INTRAVENOUS

## 2021-06-25 MED ORDER — PROPOFOL 10 MG/ML IV BOLUS
INTRAVENOUS | Status: DC | PRN
  Administered 2021-06-25: 50 mg via INTRAVENOUS
  Administered 2021-06-25: 30 mg via INTRAVENOUS
  Administered 2021-06-25: 70 mg via INTRAVENOUS
  Administered 2021-06-25: 80 mg via INTRAVENOUS

## 2021-06-25 NOTE — Anesthesia Preprocedure Evaluation (Signed)
Anesthesia Evaluation  Patient identified by MRN, date of birth, ID band Patient awake    Reviewed: Allergy & Precautions, H&P , NPO status , Patient's Chart, lab work & pertinent test results, reviewed documented beta blocker date and time   Airway Mallampati: II  TM Distance: >3 FB Neck ROM: full    Dental no notable dental hx.    Pulmonary asthma , former smoker,    Pulmonary exam normal breath sounds clear to auscultation       Cardiovascular Exercise Tolerance: Good hypertension, negative cardio ROS   Rhythm:regular Rate:Normal     Neuro/Psych negative neurological ROS  negative psych ROS   GI/Hepatic Neg liver ROS, GERD  Medicated,  Endo/Other  negative endocrine ROS  Renal/GU negative Renal ROS  negative genitourinary   Musculoskeletal negative musculoskeletal ROS (+)   Abdominal   Peds negative pediatric ROS (+)  Hematology negative hematology ROS (+)   Anesthesia Other Findings   Reproductive/Obstetrics negative OB ROS                             Anesthesia Physical Anesthesia Plan  ASA: 2  Anesthesia Plan: General   Post-op Pain Management:    Induction:   PONV Risk Score and Plan: Propofol infusion  Airway Management Planned:   Additional Equipment:   Intra-op Plan:   Post-operative Plan:   Informed Consent: I have reviewed the patients History and Physical, chart, labs and discussed the procedure including the risks, benefits and alternatives for the proposed anesthesia with the patient or authorized representative who has indicated his/her understanding and acceptance.     Dental Advisory Given  Plan Discussed with: CRNA  Anesthesia Plan Comments:         Anesthesia Quick Evaluation

## 2021-06-25 NOTE — Transfer of Care (Signed)
Immediate Anesthesia Transfer of Care Note  Patient: Stephanie Paul  Procedure(s) Performed: COLONOSCOPY WITH PROPOFOL POLYPECTOMY  Patient Location: Endoscopy Unit  Anesthesia Type:General  Level of Consciousness: awake  Airway & Oxygen Therapy: Patient Spontanous Breathing  Post-op Assessment: Report given to RN and Post -op Vital signs reviewed and stable  Post vital signs: Reviewed and stable   Last Vitals:  Vitals Value Taken Time  BP    Temp 37 C 06/25/21 1209  Pulse    Resp    SpO2      Last Pain:  Vitals:   06/25/21 1209  TempSrc: Oral  PainSc:       Patients Stated Pain Goal: 4 (57/26/20 3559)  Complications: No notable events documented.

## 2021-06-25 NOTE — H&P (Signed)
Stephanie Paul is an 45 y.o. female.   Chief Complaint: screening colonoscopy HPI: 45 y/o female with past medical history of asthma, hypertension, coming for screening colonoscopy. The patient has never had a colonoscopy in the past.  The patient denies having any complaints such as melena, hematochezia, abdominal pain or distention, change in her bowel movement consistency or frequency, no changes in her weight recently.  No family history of colorectal cancer.   Past Medical History:  Diagnosis Date   Anal fissure 05/27/2016   Asthma    Contraceptive management 01/30/2015   Dysrhythmia    Hemorrhoids 05/27/2016   Hypertension    Left ovarian cyst 09/21/2014   Menstrual cramps 09/21/2014   Ovarian cyst, right     Past Surgical History:  Procedure Laterality Date   ENDOMETRIAL ABLATION N/A 04/12/2018   Procedure: ENDOMETRIAL ABLATION MINERVA;  Surgeon: Jonnie Kind, MD;  Location: [facility];  Service: Gynecology;  Laterality: N/A;   HYSTEROSCOPY WITH D & C N/A 04/12/2018   Procedure: DILATATION  /HYSTEROSCOPY;  Surgeon: Jonnie Kind, MD;  Location: [facility];  Service: Gynecology;  Laterality: N/A;   LAPAROSCOPIC BILATERAL SALPINGECTOMY Bilateral 04/12/2018   Procedure: LAPAROSCOPIC BILATERAL SALPINGECTOMY;  Surgeon: Jonnie Kind, MD;  Location: [facility];  Service: Gynecology;  Laterality: Bilateral;   WISDOM TOOTH EXTRACTION      Family History  Problem Relation Age of Onset   Cancer Paternal Grandfather        leukemia   Hyperlipidemia Paternal Grandmother    Hypertension Paternal Grandmother    Diabetes Maternal Grandfather    Gallbladder disease Father    Diabetes Father    Hypertension Mother    Social History:  reports that she quit smoking about 13 years ago. Her smoking use included cigarettes. She has a 10.00 pack-year smoking history. She has never used smokeless tobacco. She reports current alcohol use of about 2.0 standard drinks of alcohol per week. She  reports that she does not use drugs.  Allergies:  Allergies  Allergen Reactions   Penicillins Hives, Rash and Other (See Comments)    Has patient had a PCN reaction causing immediate rash, facial/tongue/throat swelling, SOB or lightheadedness with hypotension: No Has patient had a PCN reaction causing severe rash involving mucus membranes or skin necrosis: No Has patient had a PCN reaction that required hospitalization: No Has patient had a PCN reaction occurring within the last 10 years: No  If all of the above answers are "NO", then may proceed with Cephalosporin use.     Medications Prior to Admission  Medication Sig Dispense Refill   cetirizine (ZYRTEC) 10 MG tablet Take 10 mg by mouth daily.     hydrocortisone-pramoxine (ANALPRAM-HC) 2.5-1 % rectal cream PLACE 1 APPLICATION RECTALLY THREE TIMES DAILY. (Patient taking differently: Place 1 application rectally 3 (three) times daily.) 30 g 3   magnesium gluconate (MAGONATE) 500 MG tablet Take 500 mg by mouth at bedtime.     metoprolol succinate (TOPROL-XL) 50 MG 24 hr tablet Take 50 mg by mouth 2 (two) times daily.     Multiple Vitamin (MULTIVITAMIN) tablet Take 1 tablet by mouth daily.     olmesartan-hydrochlorothiazide (BENICAR HCT) 40-25 MG tablet Take 1 tablet by mouth daily.     pantoprazole (PROTONIX) 40 MG tablet Take 40 mg by mouth daily.     polyethylene glycol-electrolytes (TRILYTE) 420 g solution Take 4,000 mLs by mouth as directed. 4000 mL 0    No results found for this  or any previous visit (from the past 48 hour(s)). No results found.  Review of Systems  Constitutional: Negative.   HENT: Negative.    Eyes: Negative.   Respiratory: Negative.    Cardiovascular: Negative.   Gastrointestinal: Negative.   Endocrine: Negative.   Genitourinary: Negative.   Musculoskeletal: Negative.   Skin: Negative.   Allergic/Immunologic: Negative.   Neurological: Negative.   Hematological: Negative.   Psychiatric/Behavioral:  Negative.     Blood pressure 125/80, pulse 82, temperature 98.6 F (37 C), temperature source Oral, resp. rate 11, height 5\' 3"  (1.6 m), weight 83.5 kg, SpO2 99 %. Physical Exam  GENERAL: The patient is AO x3, in no acute distress. HEENT: Head is normocephalic and atraumatic. EOMI are intact. Mouth is well hydrated and without lesions. NECK: Supple. No masses LUNGS: Clear to auscultation. No presence of rhonchi/wheezing/rales. Adequate chest expansion HEART: RRR, normal s1 and s2. ABDOMEN: Soft, nontender, no guarding, no peritoneal signs, and nondistended. BS +. No masses. EXTREMITIES: Without any cyanosis, clubbing, rash, lesions or edema. NEUROLOGIC: AOx3, no focal motor deficit. SKIN: no jaundice, no rashes  Assessment/Plan 45 y/o female with past medical history of asthma, hypertension, coming for screening colonoscopy. The patient is at average risk for colorectal cancer.  We will proceed with colonoscopy today.   Harvel Quale, MD 06/25/2021, 10:17 AM

## 2021-06-25 NOTE — Anesthesia Postprocedure Evaluation (Signed)
Anesthesia Post Note  Patient: Stephanie Paul  Procedure(s) Performed: COLONOSCOPY WITH PROPOFOL POLYPECTOMY  Patient location during evaluation: Phase II Anesthesia Type: General Level of consciousness: awake Pain management: pain level controlled Vital Signs Assessment: post-procedure vital signs reviewed and stable Respiratory status: spontaneous breathing and respiratory function stable Cardiovascular status: blood pressure returned to baseline and stable Postop Assessment: no headache and no apparent nausea or vomiting Anesthetic complications: no Comments: Late entry   No notable events documented.   Last Vitals:  Vitals:   06/25/21 1001 06/25/21 1209  BP: 125/80 (!) 96/51  Pulse: 82   Resp: 11 19  Temp: 37 C 37 C  SpO2: 99% 99%    Last Pain:  Vitals:   06/25/21 1209  TempSrc: Oral  PainSc: 0-No pain                 Louann Sjogren

## 2021-06-25 NOTE — Anesthesia Procedure Notes (Signed)
Date/Time: 06/25/2021 11:51 AM Performed by: Orlie Dakin, CRNA Pre-anesthesia Checklist: Patient identified, Emergency Drugs available, Suction available and Patient being monitored Patient Re-evaluated:Patient Re-evaluated prior to induction Oxygen Delivery Method: Nasal cannula Induction Type: IV induction Placement Confirmation: positive ETCO2

## 2021-06-25 NOTE — Op Note (Signed)
[facility] Patient Name: Stephanie Paul Procedure Date: 06/25/2021 11:31 AM MRN: [id number] Date of Birth: 1976/11/19 Attending MD: Maylon Peppers ,  CSN: [id number] Age: 45 Admit Type: Outpatient Procedure:                Colonoscopy Indications:              Screening for colorectal malignant neoplasm Providers:                Maylon Peppers, [place] Sharon Seller, RN, Raphael Gibney, Technician Referring MD:              Medicines:                Monitored Anesthesia Care Complications:            No immediate complications. Estimated Blood Loss:     Estimated blood loss: none. Procedure:                Pre-Anesthesia Assessment:                           - Prior to the procedure, a History and Physical                            was performed, and patient medications, allergies                            and sensitivities were reviewed. The patient's                            tolerance of previous anesthesia was reviewed.                           - The risks and benefits of the procedure and the                            sedation options and risks were discussed with the                            patient. All questions were answered and informed                            consent was obtained.                           - ASA Grade Assessment: II - A patient with mild                            systemic disease.                           After obtaining informed consent, the colonoscope                            was passed under direct vision. Throughout the  procedure, the patient's blood pressure, pulse, and                            oxygen saturations were monitored continuously. The                            [id number]-H190DL ([id number]) scope was introduced through                            the anus and advanced to the the cecum, identified                            by appendiceal orifice and ileocecal valve. The                             colonoscopy was performed without difficulty. The                            patient tolerated the procedure well. The quality                            of the bowel preparation was good. Scope In: 11:45:05 AM Scope Out: 72:09:47 PM Scope Withdrawal Time: 0 hours 15 minutes 57 seconds  Total Procedure Duration: 0 hours 21 minutes 8 seconds  Findings:      The perianal and digital rectal examinations were normal.      Three sessile polyps were found in the sigmoid colon and transverse       colon. The polyps were 3 to 5 mm in size. These polyps were removed with       a cold snare. Resection and retrieval were complete.      A diffuse area of melanosis was found in the entire colon.      Non-bleeding internal hemorrhoids were found during retroflexion. The       hemorrhoids were mild. Impression:               - Three 3 to 5 mm polyps in the sigmoid colon and                            in the transverse colon, removed with a cold snare.                            Resected and retrieved.                           - Melanosis in the colon.                           - Non-bleeding internal hemorrhoids. Moderate Sedation:      Per Anesthesia Care Recommendation:           - Discharge patient to home (ambulatory).                           - Resume previous diet.                           -  Await pathology results.                           - Repeat colonoscopy for surveillance based on                            pathology results. Procedure Code(s):        --- Professional ---                           [phone], Colonoscopy, flexible; with removal of                            tumor(s), polyp(s), or other lesion(s) by snare                            technique Diagnosis Code(s):        --- Professional ---                           Z12.11, Encounter for screening for malignant                            neoplasm of colon                           K63.5, Polyp of  colon                           K63.89, Other specified diseases of intestine                           K64.8, Other hemorrhoids CPT copyright 2019 American Medical Association. All rights reserved. The codes documented in this report are preliminary and upon coder review may  be revised to meet current compliance requirements. Maylon Peppers, MD Maylon Peppers,  06/25/2021 12:11:05 PM This report has been signed electronically. Number of Addenda: 0

## 2021-06-25 NOTE — Discharge Instructions (Signed)
You are being discharged to home.  Resume your previous diet.  We are waiting for your pathology results.  Your physician has recommended a repeat colonoscopy for surveillance based on pathology results.  

## 2021-06-26 ENCOUNTER — Encounter (INDEPENDENT_AMBULATORY_CARE_PROVIDER_SITE_OTHER): Payer: Self-pay | Admitting: *Deleted

## 2021-06-26 LAB — SURGICAL PATHOLOGY

## 2021-07-01 ENCOUNTER — Encounter (HOSPITAL_COMMUNITY): Payer: Self-pay | Admitting: Gastroenterology

## 2021-11-26 ENCOUNTER — Telehealth: Payer: Self-pay | Admitting: Adult Health

## 2021-11-26 ENCOUNTER — Other Ambulatory Visit (INDEPENDENT_AMBULATORY_CARE_PROVIDER_SITE_OTHER): Payer: Self-pay

## 2021-11-26 ENCOUNTER — Other Ambulatory Visit: Payer: Self-pay

## 2021-11-26 DIAGNOSIS — R399 Unspecified symptoms and signs involving the genitourinary system: Secondary | ICD-10-CM

## 2021-11-26 LAB — POCT URINALYSIS DIPSTICK OB
Blood, UA: NEGATIVE
Glucose, UA: NEGATIVE
Ketones, UA: NEGATIVE
Leukocytes, UA: NEGATIVE
Nitrite, UA: NEGATIVE
POC,PROTEIN,UA: NEGATIVE

## 2021-11-26 NOTE — Progress Notes (Signed)
° °  NURSE VISIT- UTI SYMPTOMS   SUBJECTIVE:  Stephanie Paul is a 45 y.o. G1P1 female here for UTI symptoms. She is a GYN patient. She reports urinary frequency.  OBJECTIVE:  There were no vitals taken for this visit.  Appears well, in no apparent distress  Results for orders placed or performed in visit on 11/26/21 (from the past 24 hour(s))  POC Urinalysis Dipstick OB   Collection Time: 11/26/21  3:03 PM  Result Value Ref Range   Color, UA     Clarity, UA     Glucose, UA Negative Negative   Bilirubin, UA     Ketones, UA neg    Spec Grav, UA     Blood, UA neg    pH, UA     POC,PROTEIN,UA Negative Negative, Trace, Small (1+), Moderate (2+), Large (3+), 4+   Urobilinogen, UA     Nitrite, UA neg    Leukocytes, UA Negative Negative   Appearance     Odor      ASSESSMENT: GYN patient with UTI symptoms and negative nitrites  PLAN: Discussed with Derrek Monaco, AGNP   Rx sent by provider today: No Urine culture sent Call or return to clinic prn if these symptoms worsen or fail to improve as anticipated. Follow-up: as needed   Simrit Gohlke A Calley Drenning  11/26/2021 3:03 PM

## 2021-11-26 NOTE — Telephone Encounter (Signed)
Feels like needs to pee, even when just peed, get appt for urine check with nurse. Keep using hemorrhoid cream,fiber and sitz baths

## 2021-11-26 NOTE — Progress Notes (Signed)
Chart reviewed for nurse visit. Agree with plan of care.  Estill Dooms, NP 11/26/2021 3:19 PM

## 2021-11-26 NOTE — Telephone Encounter (Signed)
Patient calling stating that she has been using the hemorrhoid cream  and it is not getting any better but is not worse either. She also states that she is not able to empty her bladder wants to know if she needs to come in or are you able to send something in?

## 2021-11-27 LAB — URINALYSIS, ROUTINE W REFLEX MICROSCOPIC
Bilirubin, UA: NEGATIVE
Glucose, UA: NEGATIVE
Ketones, UA: NEGATIVE
Leukocytes,UA: NEGATIVE
Nitrite, UA: NEGATIVE
Protein,UA: NEGATIVE
RBC, UA: NEGATIVE
Specific Gravity, UA: 1.01 (ref 1.005–1.030)
Urobilinogen, Ur: 0.2 mg/dL (ref 0.2–1.0)
pH, UA: 6 (ref 5.0–7.5)

## 2021-12-01 ENCOUNTER — Other Ambulatory Visit: Payer: Self-pay | Admitting: Adult Health

## 2021-12-01 DIAGNOSIS — R3915 Urgency of urination: Secondary | ICD-10-CM

## 2021-12-01 LAB — URINE CULTURE

## 2021-12-01 NOTE — Progress Notes (Signed)
Urine negative, will refer to urology

## 2022-04-10 ENCOUNTER — Other Ambulatory Visit (HOSPITAL_COMMUNITY): Payer: Self-pay | Admitting: Adult Health

## 2022-04-10 ENCOUNTER — Other Ambulatory Visit (HOSPITAL_COMMUNITY): Payer: Self-pay | Admitting: Family Medicine

## 2022-04-10 DIAGNOSIS — Z1231 Encounter for screening mammogram for malignant neoplasm of breast: Secondary | ICD-10-CM

## 2022-04-13 ENCOUNTER — Ambulatory Visit (HOSPITAL_COMMUNITY)
Admission: RE | Admit: 2022-04-13 | Discharge: 2022-04-13 | Disposition: A | Discharge status: Home / Self Care | Payer: 59 | Source: Ambulatory Visit | Attending: Adult Health | Admitting: Adult Health

## 2022-04-13 DIAGNOSIS — Z1231 Encounter for screening mammogram for malignant neoplasm of breast: Secondary | ICD-10-CM | POA: Diagnosis not present

## 2022-09-21 NOTE — Progress Notes (Deleted)
Cardiology Office Note:    Date:  09/21/2022   ID:  Stephanie Paul, DOB 11/11/1976, MRN [id number]  PCP:  Sharilyn Sites, MD   [facility] Providers Cardiologist:  None {  Referring MD: Sharilyn Sites, MD    History of Present Illness:    Stephanie Paul is a 46 y.o. female with a hx of HTN who was referred by Dr. Hilma Favors for further evaluation of SVT.  Patient was seen by Dr. Hilma Favors on 09/21/22 for rapid heart rate. Note reviewed. She presented to clinic with palpitations found to be in SVT. She is now referred to Cardiology clinic for further evaluation.  Today, ***  Past Medical History:  Diagnosis Date   Anal fissure 05/27/2016   Asthma    Contraceptive management 01/30/2015   Dysrhythmia    Hemorrhoids 05/27/2016   Hypertension    Left ovarian cyst 09/21/2014   Menstrual cramps 09/21/2014   Ovarian cyst, right     Past Surgical History:  Procedure Laterality Date   COLONOSCOPY WITH PROPOFOL N/A 06/25/2021   Procedure: COLONOSCOPY WITH PROPOFOL;  Surgeon: Harvel Quale, MD;  Location: [facility];  Service: Gastroenterology;  Laterality: N/A;  11:15   ENDOMETRIAL ABLATION N/A 04/12/2018   Procedure: ENDOMETRIAL ABLATION MINERVA;  Surgeon: Jonnie Kind, MD;  Location: [facility];  Service: Gynecology;  Laterality: N/A;   HYSTEROSCOPY WITH D & C N/A 04/12/2018   Procedure: DILATATION  /HYSTEROSCOPY;  Surgeon: Jonnie Kind, MD;  Location: [facility];  Service: Gynecology;  Laterality: N/A;   LAPAROSCOPIC BILATERAL SALPINGECTOMY Bilateral 04/12/2018   Procedure: LAPAROSCOPIC BILATERAL SALPINGECTOMY;  Surgeon: Jonnie Kind, MD;  Location: [facility];  Service: Gynecology;  Laterality: Bilateral;   POLYPECTOMY  06/25/2021   Procedure: POLYPECTOMY;  Surgeon: Montez Morita, Quillian Quince, MD;  Location: [facility];  Service: Gastroenterology;;   WISDOM TOOTH EXTRACTION      Current Medications: No outpatient medications have been marked as taking  for the 09/22/22 encounter (Appointment) with Freada Bergeron, MD.     Allergies:   Penicillins   Social History   Socioeconomic History   Marital status: Married    Spouse name: Not on file   Number of children: Not on file   Years of education: Not on file   Highest education level: Not on file  Occupational History   Not on file  Tobacco Use   Smoking status: Former    Packs/day: 1.00    Years: 10.00    Total pack years: 10.00    Types: Cigarettes    Quit date: 04/06/2008    Years since quitting: 14.4   Smokeless tobacco: Never   Tobacco comments:    quit 2010 after smoking 10 yrs  Vaping Use   Vaping Use: Never used  Substance and Sexual Activity   Alcohol use: Yes    Alcohol/week: 2.0 standard drinks of alcohol    Types: 2 Cans of beer per week    Comment: occ   Drug use: No   Sexual activity: Yes    Birth control/protection: Surgical  Other Topics Concern   Not on file  Social History Narrative   Not on file   Social Determinants of Health   Financial Resource Strain: Low Risk  (05/15/2021)   Overall Financial Resource Strain (CARDIA)    Difficulty of Paying Living Expenses: Not hard at all  Food Insecurity: No Food Insecurity (05/15/2021)   Hunger Vital Sign    Worried About Running  Out of Food in the Last Year: Never true    Ran Out of Food in the Last Year: Never true  Transportation Needs: No Transportation Needs (05/15/2021)   PRAPARE - Hydrologist (Medical): No    Lack of Transportation (Non-Medical): No  Physical Activity: Insufficiently Active (05/15/2021)   Exercise Vital Sign    Days of Exercise per Week: 4 days    Minutes of Exercise per Session: 20 min  Stress: Stress Concern Present (05/15/2021)   [place]    Feeling of Stress : To some extent  Social Connections: Moderately Integrated (05/15/2021)   Social Connection and Isolation Panel [NHANES]     Frequency of Communication with Friends and Family: More than three times a week    Frequency of Social Gatherings with Friends and Family: Three times a week    Attends Religious Services: More than 4 times per year    Active Member of Clubs or Organizations: No    Attends Archivist Meetings: Never    Marital Status: Married     Family History: The patient's ***family history includes Cancer in her paternal grandfather; Diabetes in her father and maternal grandfather; Gallbladder disease in her father; Hyperlipidemia in her paternal grandmother; Hypertension in her mother and paternal grandmother.  ROS:   Please see the history of present illness.    *** All other systems reviewed and are negative.  EKGs/Labs/Other Studies Reviewed:    The following studies were reviewed today: ***  EKG:  EKG is *** ordered today.  The ekg ordered today demonstrates ***  Recent Labs: No results found for requested labs within last 365 days.  Recent Lipid Panel    Component Value Date/Time   CHOL 150 03/29/2013 0952   TRIG 107 03/29/2013 0952   HDL 41 03/29/2013 0952   CHOLHDL 3.7 03/29/2013 0952   VLDL 21 03/29/2013 0952   LDLCALC 88 03/29/2013 0952     Risk Assessment/Calculations:   {Does this patient have ATRIAL FIBRILLATION?:[phone]}  No BP recorded.  {Refresh Note OR Click here to enter BP  :1}***         Physical Exam:    VS:  There were no vitals taken for this visit.    Wt Readings from Last 3 Encounters:  06/25/21 184 lb (83.5 kg)  05/15/21 185 lb 9.6 oz (84.2 kg)  01/11/20 191 lb 6.4 oz (86.8 kg)     GEN: *** Well nourished, well developed in no acute distress HEENT: Normal NECK: No JVD; No carotid bruits LYMPHATICS: No lymphadenopathy CARDIAC: ***RRR, no murmurs, rubs, gallops RESPIRATORY:  Clear to auscultation without rales, wheezing or rhonchi  ABDOMEN: Soft, non-tender, non-distended MUSCULOSKELETAL:  No edema; No deformity  SKIN: Warm and  dry NEUROLOGIC:  Alert and oriented x 3 PSYCHIATRIC:  Normal affect   ASSESSMENT:    No diagnosis found. PLAN:    In order of problems listed above:  #SVT: -Check zio monitor -Check TTE -Continue metop '50mg'$  XL daily  #HTN: -Continue metop '50mg'$  XL daily -Continue olmesartan-HCTZ 40-'25mg'$  daily      {Are you ordering a CV Procedure (e.g. stress test, cath, DCCV, TEE, etc)?   Press F2        :[id number]}    Medication Adjustments/Labs and Tests Ordered: Current medicines are reviewed at length with the patient today.  Concerns regarding medicines are outlined above.  No orders of the defined types were placed in  this encounter.  No orders of the defined types were placed in this encounter.   There are no Patient Instructions on file for this visit.   Signed, Freada Bergeron, MD  09/21/2022 7:56 PM    [place]

## 2022-09-22 ENCOUNTER — Encounter: Payer: Self-pay | Admitting: Cardiology

## 2022-09-22 ENCOUNTER — Ambulatory Visit: Payer: 59 | Attending: Cardiology

## 2022-09-22 ENCOUNTER — Ambulatory Visit: Payer: 59 | Attending: Cardiology | Admitting: Cardiology

## 2022-09-22 ENCOUNTER — Telehealth: Payer: Self-pay | Admitting: *Deleted

## 2022-09-22 VITALS — BP 118/76 | HR 77 | Ht 63.0 in | Wt 195.2 lb

## 2022-09-22 DIAGNOSIS — R002 Palpitations: Secondary | ICD-10-CM

## 2022-09-22 DIAGNOSIS — I10 Essential (primary) hypertension: Secondary | ICD-10-CM

## 2022-09-22 MED ORDER — METOPROLOL TARTRATE 25 MG PO TABS: 25.0000 mg | ORAL_TABLET | Freq: Three times a day (TID) | ORAL | 3 refills | Status: DC | PRN

## 2022-09-22 NOTE — Patient Instructions (Signed)
Medication Instructions:   START TAKING METOPROLOL TARTRATE (LOPRESSOR) 25 MG BY MOUTH THREE TIMES DAILY AS NEEDED FOR PALPITATIONS  *If you need a refill on your cardiac medications before your next appointment, please call your pharmacy*   Testing/Procedures:  Your physician has requested that you have an echocardiogram. Echocardiography is a painless test that uses sound waves to create images of your heart. It provides your doctor with information about the size and shape of your heart and how well your heart's chambers and valves are working. This procedure takes approximately one hour. There are no restrictions for this procedure.   ZIO XT- Long Term Monitor Instructions  Your physician has requested you wear a ZIO patch monitor for 14 days.  This is a single patch monitor. [employer] supplies one patch monitor per enrollment. Additional stickers are not available. Please do not apply patch if you will be having a Nuclear Stress Test,  Echocardiogram, Cardiac CT, MRI, or Chest Xray during the period you would be wearing the  monitor. The patch cannot be worn during these tests. You cannot remove and re-apply the  ZIO XT patch monitor.  Your ZIO patch monitor will be mailed 3 day USPS to your address on file. It may take 3-5 days  to receive your monitor after you have been enrolled.  Once you have received your monitor, please review the enclosed instructions. Your monitor  has already been registered assigning a specific monitor serial # to you.  Billing and Patient Assistance Program Information  We have supplied [employer] with any of your insurance information on file for billing purposes. [employer] offers a sliding scale Patient Assistance Program for patients that do not have  insurance, or whose insurance does not completely cover the cost of the ZIO monitor.  You must apply for the Patient Assistance Program to qualify for this discounted rate.  To apply, please call [employer] at  [phone], select option 4, select option 2, ask to apply for  Patient Assistance Program. Theodore Demark will ask your household income, and how many people  are in your household. They will quote your out-of-pocket cost based on that information.  [employer] will also be able to set up a 46-month interest-free payment plan if needed.  Applying the monitor   Shave hair from upper left chest.  Hold abrader disc by orange tab. Rub abrader in 40 strokes over the upper left chest as  indicated in your monitor instructions.  Clean area with 4 enclosed alcohol pads. Let dry.  Apply patch as indicated in monitor instructions. Patch will be placed under collarbone on left  side of chest with arrow pointing upward.  Rub patch adhesive wings for 2 minutes. Remove white label marked "1". Remove the white  label marked "2". Rub patch adhesive wings for 2 additional minutes.  While looking in a mirror, press and release button in center of patch. A small green light will  flash 3-4 times. This will be your only indicator that the monitor has been turned on.  Do not shower for the first 24 hours. You may shower after the first 24 hours.  Press the button if you feel a symptom. You will hear a small click. Record Date, Time and  Symptom in the Patient Logbook.  When you are ready to remove the patch, follow instructions on the last 2 pages of Patient  Logbook. Stick patch monitor onto the last page of Patient Logbook.  Place Patient Logbook in the blue and white box.  Use locking tab on box and tape box closed  securely. The blue and white box has prepaid postage on it. Please place it in the mailbox as  soon as possible. Your physician should have your test results approximately 7 days after the  monitor has been mailed back to [facility].  Call [place] at [phone] if you have questions regarding  your ZIO XT patch monitor. Call them immediately if you see an orange light  blinking on your  monitor.  If your monitor falls off in less than 4 days, contact our Monitor department at [phone].  If your monitor becomes loose or falls off after 4 days call [employer] at [phone] for  suggestions on securing your monitor    Follow-Up: At [facility], you and your health needs are our priority.  As part of our continuing mission to provide you with exceptional heart care, we have created designated Provider Care Teams.  These Care Teams include your primary Cardiologist (physician) and Advanced Practice Providers (APPs -  Physician Assistants and Nurse Practitioners) who all work together to provide you with the care you need, when you need it.  We recommend signing up for the patient portal called "MyChart".  Sign up information is provided on this After Visit Summary.  MyChart is used to connect with patients for Virtual Visits (Telemedicine).  Patients are able to view lab/test results, encounter notes, upcoming appointments, etc.  Non-urgent messages can be sent to your provider as well.   To learn more about what you can do with MyChart, go to [email or web address].    Your next appointment:   6 month(s)  The format for your next appointment:   In Person  Provider:   Robbie Lis, PA-C, Nicholes Rough, PA-C, Melina Copa, PA-C, Ambrose Pancoast, NP, Cecilie Kicks, NP, Ermalinda Barrios, PA-C, Christen Bame, NP, or Richardson Dopp, PA-C         Important Information About Sugar

## 2022-09-22 NOTE — Progress Notes (Signed)
Cardiology Office Note:    Date:  09/22/2022   ID:  Stephanie Paul, DOB 01/16/1976, MRN [id number]  PCP:  Sharilyn Sites, MD   [facility] Providers Cardiologist:  None {  Referring MD: Sharilyn Sites, MD    History of Present Illness:    Stephanie Paul is a 46 y.o. female with a hx of HTN who was referred by Dr. Hilma Favors for further evaluation of SVT.  Patient was seen by Dr. Hilma Favors on 09/21/22 for rapid heart rate. Note reviewed. She presented to clinic with palpitations found to be in SVT. She is now referred to Cardiology clinic for further evaluation.  Today, the patient states that she had been weightlifting 2 days ago and her heart rate had gone from 140bpm to 210bpm. This lasted for 20 minutes and resolved after she drove home and took her Toprol XL. Patient was wearing her Apple watch at that time but did not check an ECG. She felt anxious during this episode but denies any associated presyncopal symptoms or chest pain. Patient has had an ache between her shoulder blades a few times since this episode. Has not had any further episodes since that time.   She denies any history of chest pain or shortness of breath during exertion. She complains of slight dependent BLE edema. This resolves by the end of the day.   She reports a family hx of A-fib in her mother in her late 64's or early 43's.  Past Medical History:  Diagnosis Date   Anal fissure 05/27/2016   Asthma    Contraceptive management 01/30/2015   Dysrhythmia    Hemorrhoids 05/27/2016   Hypertension    Left ovarian cyst 09/21/2014   Menstrual cramps 09/21/2014   Ovarian cyst, right     Past Surgical History:  Procedure Laterality Date   COLONOSCOPY WITH PROPOFOL N/A 06/25/2021   Procedure: COLONOSCOPY WITH PROPOFOL;  Surgeon: Harvel Quale, MD;  [facility];  Service: Gastroenterology;  Laterality: N/A;  11:15   ENDOMETRIAL ABLATION N/A 04/12/2018   Procedure: ENDOMETRIAL  ABLATION MINERVA;  Surgeon: Jonnie Kind, MD;  Location: [facility];  Service: Gynecology;  Laterality: N/A;   HYSTEROSCOPY WITH D & C N/A 04/12/2018   Procedure: DILATATION  /HYSTEROSCOPY;  Surgeon: Jonnie Kind, MD;  Location: [facility];  Service: Gynecology;  Laterality: N/A;   LAPAROSCOPIC BILATERAL SALPINGECTOMY Bilateral 04/12/2018   Procedure: LAPAROSCOPIC BILATERAL SALPINGECTOMY;  Surgeon: Jonnie Kind, MD;  Location: [facility];  Service: Gynecology;  Laterality: Bilateral;   POLYPECTOMY  06/25/2021   Procedure: POLYPECTOMY;  Surgeon: Montez Morita, Quillian Quince, MD;  Location: [facility];  Service: Gastroenterology;;   WISDOM TOOTH EXTRACTION      Current Medications: Current Meds  Medication Sig   cetirizine (ZYRTEC) 10 MG tablet Take 10 mg by mouth daily.   hydrocortisone-pramoxine (ANALPRAM-HC) 2.5-1 % rectal cream PLACE 1 APPLICATION RECTALLY THREE TIMES DAILY.   magnesium gluconate (MAGONATE) 500 MG tablet Take 500 mg by mouth at bedtime.   metoprolol succinate (TOPROL-XL) 50 MG 24 hr tablet Take 50 mg by mouth 2 (two) times daily.   metoprolol tartrate (LOPRESSOR) 25 MG tablet Take 1 tablet (25 mg total) by mouth 3 (three) times daily as needed (palpitations).   Multiple Vitamin (MULTIVITAMIN) tablet Take 1 tablet by mouth daily.   olmesartan-hydrochlorothiazide (BENICAR HCT) 40-25 MG tablet Take 1 tablet by mouth daily.   pantoprazole (PROTONIX) 40 MG tablet Take 40 mg by mouth daily.  Allergies:   Penicillins   Social History   Socioeconomic History   Marital status: Married    Spouse name: Not on file   Number of children: Not on file   Years of education: Not on file   Highest education level: Not on file  Occupational History   Not on file  Tobacco Use   Smoking status: Former    Packs/day: 1.00    Years: 10.00    Total pack years: 10.00    Types: Cigarettes    Quit date: 04/06/2008    Years since quitting: 14.4   Smokeless tobacco: Never   Tobacco  comments:    quit 2010 after smoking 10 yrs  Vaping Use   Vaping Use: Never used  Substance and Sexual Activity   Alcohol use: Yes    Alcohol/week: 2.0 standard drinks of alcohol    Types: 2 Cans of beer per week    Comment: occ   Drug use: No   Sexual activity: Yes    Birth control/protection: Surgical  Other Topics Concern   Not on file  Social History Narrative   Not on file   Social Determinants of Health   Financial Resource Strain: Low Risk  (05/15/2021)   Overall Financial Resource Strain (CARDIA)    Difficulty of Paying Living Expenses: Not hard at all  Food Insecurity: No Food Insecurity (05/15/2021)   Hunger Vital Sign    Worried About Running Out of Food in the Last Year: Never true    Ran Out of Food in the Last Year: Never true  Transportation Needs: No Transportation Needs (05/15/2021)   PRAPARE - Hydrologist (Medical): No    Lack of Transportation (Non-Medical): No  Physical Activity: Insufficiently Active (05/15/2021)   Exercise Vital Sign    Days of Exercise per Week: 4 days    Minutes of Exercise per Session: 20 min  Stress: Stress Concern Present (05/15/2021)   [place]    Feeling of Stress : To some extent  Social Connections: Moderately Integrated (05/15/2021)   Social Connection and Isolation Panel [NHANES]    Frequency of Communication with Friends and Family: More than three times a week    Frequency of Social Gatherings with Friends and Family: Three times a week    Attends Religious Services: More than 4 times per year    Active Member of Clubs or Organizations: No    Attends Archivist Meetings: Never    Marital Status: Married     Family History: The patient's family history includes Cancer in her paternal grandfather; Diabetes in her father and maternal grandfather; Gallbladder disease in her father; Hyperlipidemia in her paternal grandmother;  Hypertension in her mother and paternal grandmother.  ROS:   Please see the history of present illness.    Review of Systems  Constitutional:  Negative for chills and fever.  HENT:  Negative for nosebleeds and tinnitus.   Eyes:  Negative for blurred vision and pain.  Respiratory:  Negative for cough, hemoptysis, shortness of breath and stridor.   Cardiovascular:  Positive for leg swelling. Negative for chest pain, palpitations, orthopnea, claudication and PND.  Gastrointestinal:  Negative for blood in stool, diarrhea, nausea and vomiting.  Genitourinary:  Negative for dysuria and hematuria.  Musculoskeletal:  Negative for falls.  Neurological:  Negative for dizziness, loss of consciousness and headaches.  Psychiatric/Behavioral:  Negative for depression, hallucinations and substance abuse. The patient  does not have insomnia.      EKGs/Labs/Other Studies Reviewed:    The following studies were reviewed today: No prior CV studies  EKG:  EKG is ordered today.  The ekg ordered today demonstrates NSR 77 bpm  Recent Labs: No results found for requested labs within last 365 days.  Recent Lipid Panel    Component Value Date/Time   CHOL 150 03/29/2013 0952   TRIG 107 03/29/2013 0952   HDL 41 03/29/2013 0952   CHOLHDL 3.7 03/29/2013 0952   VLDL 21 03/29/2013 0952   LDLCALC 88 03/29/2013 0952     Risk Assessment/Calculations:     Physical Exam:    VS:  BP 118/76   Pulse 77   Ht '5\' 3"'$  (1.6 m)   Wt 195 lb 3.2 oz (88.5 kg)   SpO2 96%   BMI 34.58 kg/m     Wt Readings from Last 3 Encounters:  09/22/22 195 lb 3.2 oz (88.5 kg)  06/25/21 184 lb (83.5 kg)  05/15/21 185 lb 9.6 oz (84.2 kg)     GEN:  Well nourished, well developed in no acute distress HEENT: Normal NECK: No JVD; No carotid bruits CARDIAC: RRR, no murmurs, rubs, gallops RESPIRATORY:  Clear to auscultation without rales, wheezing or rhonchi  ABDOMEN: Soft, non-tender, non-distended MUSCULOSKELETAL:  No edema;  No deformity  SKIN: Warm and dry NEUROLOGIC:  Alert and oriented x 3 PSYCHIATRIC:  Normal affect   ASSESSMENT:    1. Palpitations   2. Primary hypertension    PLAN:    In order of problems listed above:  #SVT: Patient with suspected episode of SVT while lifting weights at the gym. HR jumped from 140 to 200s. Had the sensation of palpitations at that time but no presyncopal symptoms or chest pain. Took additional metop at that time and her symptoms resolved ~16mn later. Suspect this was related to underlying SVT, but will check zio to assess further. Discussed vagal maneuvers as well.  -Check 2 week zio monitor -Check TTE -Continue metop '100mg'$  XL daily -Start metop '25mg'$  TID prn for breakthrough palpitations -Discussed vagal maneuvers   #HTN: Well controlled.  -Continue metop '100mg'$  XL daily -Continue olmesartan-HCTZ 40-'25mg'$  daily      Medication Adjustments/Labs and Tests Ordered: Current medicines are reviewed at length with the patient today.  Concerns regarding medicines are outlined above.  Orders Placed This Encounter  Procedures   LONG TERM MONITOR (3-14 DAYS)   EKG 12-Lead   ECHOCARDIOGRAM COMPLETE   Meds ordered this encounter  Medications   metoprolol tartrate (LOPRESSOR) 25 MG tablet    Sig: Take 1 tablet (25 mg total) by mouth 3 (three) times daily as needed (palpitations).    Dispense:  90 tablet    Refill:  3    Patient Instructions  Medication Instructions:   START TAKING METOPROLOL TARTRATE (LOPRESSOR) 25 MG BY MOUTH THREE TIMES DAILY AS NEEDED FOR PALPITATIONS  *If you need a refill on your cardiac medications before your next appointment, please call your pharmacy*   Testing/Procedures:  Your physician has requested that you have an echocardiogram. Echocardiography is a painless test that uses sound waves to create images of your heart. It provides your doctor with information about the size and shape of your heart and how well your heart's  chambers and valves are working. This procedure takes approximately one hour. There are no restrictions for this procedure.   ZIO XT- Long Term Monitor Instructions  Your physician has requested you wear a  ZIO patch monitor for 14 days.  This is a single patch monitor. [employer] supplies one patch monitor per enrollment. Additional stickers are not available. Please do not apply patch if you will be having a Nuclear Stress Test,  Echocardiogram, Cardiac CT, MRI, or Chest Xray during the period you would be wearing the  monitor. The patch cannot be worn during these tests. You cannot remove and re-apply the  ZIO XT patch monitor.  Your ZIO patch monitor will be mailed 3 day USPS to your address on file. It may take 3-5 days  to receive your monitor after you have been enrolled.  Once you have received your monitor, please review the enclosed instructions. Your monitor  has already been registered assigning a specific monitor serial # to you.  Billing and Patient Assistance Program Information  We have supplied [employer] with any of your insurance information on file for billing purposes. [employer] offers a sliding scale Patient Assistance Program for patients that do not have  insurance, or whose insurance does not completely cover the cost of the ZIO monitor.  You must apply for the Patient Assistance Program to qualify for this discounted rate.  To apply, please call [employer] at [phone], select option 4, select option 2, ask to apply for  Patient Assistance Program. Theodore Demark will ask your household income, and how many people  are in your household. They will quote your out-of-pocket cost based on that information.  [employer] will also be able to set up a 62-month interest-free payment plan if needed.  Applying the monitor   Shave hair from upper left chest.  Hold abrader disc by orange tab. Rub abrader in 40 strokes over the upper left chest as  indicated in your monitor instructions.   Clean area with 4 enclosed alcohol pads. Let dry.  Apply patch as indicated in monitor instructions. Patch will be placed under collarbone on left  side of chest with arrow pointing upward.  Rub patch adhesive wings for 2 minutes. Remove white label marked "1". Remove the white  label marked "2". Rub patch adhesive wings for 2 additional minutes.  While looking in a mirror, press and release button in center of patch. A small green light will  flash 3-4 times. This will be your only indicator that the monitor has been turned on.  Do not shower for the first 24 hours. You may shower after the first 24 hours.  Press the button if you feel a symptom. You will hear a small click. Record Date, Time and  Symptom in the Patient Logbook.  When you are ready to remove the patch, follow instructions on the last 2 pages of Patient  Logbook. Stick patch monitor onto the last page of Patient Logbook.  Place Patient Logbook in the blue and white box. Use locking tab on box and tape box closed  securely. The blue and white box has prepaid postage on it. Please place it in the mailbox as  soon as possible. Your physician should have your test results approximately 7 days after the  monitor [facility]  Call [place] [phone] you have questions regarding  your ZIO XT patch monitor. Call them immediately if you see an orange light blinking on your  monitor.  If your monitor falls off in less than 4 days, contact our Monitor department at [phone]  If your monitor becomes loose or falls off after 4 days call [employer] at [phone]  suggestions on securing your monitor    Follow-Up: At [facility], you and your health needs are our priority.  As part of our continuing mission to provide you with exceptional heart care, we have created designated Provider Care Teams.  These Care Teams include your primary Cardiologist (physician)  and Advanced Practice Providers (APPs -  Physician Assistants and Nurse Practitioners) who all work together to provide you with the care you need, when you need it.  We recommend signing up for the patient portal called "MyChart".  Sign up information is provided on this After Visit Summary.  MyChart is used to connect with patients for Virtual Visits (Telemedicine).  Patients are able to view lab/test results, encounter notes, upcoming appointments, etc.  Non-urgent messages can be sent to your provider as well.   To learn more about what you can do with MyChart, go to [email or web address].    Your next appointment:   6 month(s)  The format for your next appointment:   In Person  Provider:   Robbie Lis, PA-C, Nicholes Rough, PA-C, Melina Copa, PA-C, Ambrose Pancoast, NP, Cecilie Kicks, NP, Ermalinda Barrios, PA-C, Christen Bame, NP, or Richardson Dopp, PA-C         Important Information About Sugar         This document serves as a record of services personally performed by Gwyndolyn Kaufman, MD. It was created on her behalf by Eugene Gavia, a trained medical scribe. The creation of this record is based on the scribe's personal observations and the provider's statements to them. This document has been checked and approved by the attending provider.  I, Freada Bergeron, MD, have reviewed all documentation for this visit. The documentation on 09/22/22 for the exam, diagnosis, procedures, and orders are all accurate and complete.   Signed, Freada Bergeron, MD  09/22/2022 3:28 PM    [place]

## 2022-09-22 NOTE — Telephone Encounter (Signed)
-----   Message from Jennefer Bravo sent at 09/22/2022  3:30 PM EDT ----- Regarding: RE: 14 DAY ZIO PER DR. Johney Frame done ----- Message ----- From: Nuala Alpha, LPN Sent: 25/00/3704   3:20 PM EDT To: Nuala Alpha, LPN; Shelly A Wells Subject: [place] ZIO PER DR. Johney Frame                   Dr. Johney Frame ordered a 14 day zio on this pt for palpitations  Please enroll and let me know when you do.  Thanks [employer]

## 2022-09-22 NOTE — Progress Notes (Unsigned)
Enrolled for [employer] to mail a ZIO XT long term holter monitor to the patients address on file.  

## 2022-10-06 ENCOUNTER — Ambulatory Visit (HOSPITAL_COMMUNITY): Payer: 59 | Attending: Cardiology

## 2022-10-06 ENCOUNTER — Telehealth: Payer: Self-pay | Admitting: Cardiology

## 2022-10-06 DIAGNOSIS — R002 Palpitations: Secondary | ICD-10-CM

## 2022-10-06 LAB — ECHOCARDIOGRAM COMPLETE
Area-P 1/2: 3.31 cm2
S' Lateral: 2.9 cm

## 2022-10-06 NOTE — Telephone Encounter (Signed)
The patient has been notified of the result and verbalized understanding.  All questions (if any) were answered. Nuala Alpha, LPN [id number] 6:60 PM

## 2022-10-06 NOTE — Telephone Encounter (Signed)
Patient was returning call. Please advise ?

## 2022-10-06 NOTE — Telephone Encounter (Signed)
-----   Message from Freada Bergeron, MD sent at 10/06/2022  3:42 PM EDT ----- Her echo looks great with normal pumping function and no significant valve disease.

## 2022-10-25 ENCOUNTER — Encounter (INDEPENDENT_AMBULATORY_CARE_PROVIDER_SITE_OTHER): Payer: Self-pay | Admitting: Gastroenterology

## 2022-12-23 ENCOUNTER — Other Ambulatory Visit: Payer: Self-pay

## 2022-12-23 DIAGNOSIS — R002 Palpitations: Secondary | ICD-10-CM

## 2022-12-23 MED ORDER — METOPROLOL TARTRATE 25 MG PO TABS: 25.0000 mg | ORAL_TABLET | Freq: Three times a day (TID) | ORAL | 9 refills | Status: DC | PRN

## 2023-05-25 ENCOUNTER — Other Ambulatory Visit (HOSPITAL_COMMUNITY): Payer: Self-pay | Admitting: Adult Health

## 2023-05-25 DIAGNOSIS — Z1231 Encounter for screening mammogram for malignant neoplasm of breast: Secondary | ICD-10-CM

## 2023-05-31 ENCOUNTER — Ambulatory Visit (HOSPITAL_COMMUNITY)
Admission: RE | Admit: 2023-05-31 | Discharge: 2023-05-31 | Disposition: A | Discharge status: Home / Self Care | Payer: 59 | Source: Ambulatory Visit | Attending: Adult Health | Admitting: Adult Health

## 2023-05-31 DIAGNOSIS — Z1231 Encounter for screening mammogram for malignant neoplasm of breast: Secondary | ICD-10-CM | POA: Insufficient documentation

## 2023-07-15 ENCOUNTER — Encounter: Payer: Self-pay | Admitting: Adult Health

## 2023-07-15 ENCOUNTER — Other Ambulatory Visit: Payer: Self-pay | Admitting: Adult Health

## 2023-07-15 ENCOUNTER — Ambulatory Visit (INDEPENDENT_AMBULATORY_CARE_PROVIDER_SITE_OTHER): Payer: 59 | Admitting: Adult Health

## 2023-07-15 VITALS — BP 111/77 | HR 81 | Ht 63.0 in | Wt 174.0 lb

## 2023-07-15 DIAGNOSIS — Z01419 Encounter for gynecological examination (general) (routine) without abnormal findings: Secondary | ICD-10-CM

## 2023-07-15 DIAGNOSIS — Z1211 Encounter for screening for malignant neoplasm of colon: Secondary | ICD-10-CM

## 2023-07-15 DIAGNOSIS — K649 Unspecified hemorrhoids: Secondary | ICD-10-CM

## 2023-07-15 DIAGNOSIS — Z13220 Encounter for screening for lipoid disorders: Secondary | ICD-10-CM

## 2023-07-15 LAB — HEMOCCULT GUIAC POC 1CARD (OFFICE): Fecal Occult Blood, POC: NEGATIVE

## 2023-07-15 MED ORDER — HYDROCORTISONE ACETATE 25 MG RE SUPP: 25.0000 mg | Freq: Two times a day (BID) | RECTAL | 0 refills | Status: DC

## 2023-07-15 MED ORDER — HYDROCORT-PRAMOXINE (PERIANAL) 2.5-1 % EX CREA: TOPICAL_CREAM | Freq: Three times a day (TID) | CUTANEOUS | 3 refills | Status: DC

## 2023-07-15 NOTE — Progress Notes (Signed)
Patient ID: [name], female   DOB: [birth year], 47 y.o.   MRN: [id number] History of Present Illness: Stephanie Paul is a 47 year old white female,married, G1P1, in for a well woman gyn exam, and is complaining of hemorrhoids, and the y bleed at times.     Component Value Date/Time   DIAGPAP  05/15/2021 1049    - Negative for intraepithelial lesion or malignancy (NILM)   HPVHIGH Negative 05/15/2021 1049   ADEQPAP  05/15/2021 1049    Satisfactory for evaluation; transformation zone component PRESENT.    PCP is Dr Phillips Odor   Current Medications, Allergies, Past Medical History, Past Surgical History, Family History and Social History were reviewed in [employer] electronic medical record.     Review of Systems: Patient denies any headaches, hearing loss, fatigue, blurred vision, shortness of breath, chest pain, abdominal pain, problems with bowel movements, urination, or intercourse. No joint pain or mood swings. Denies any bleeding, had ablation +hemorrhoids  She has lost weight and feels better  Physical Exam:BP 111/77 (BP Location: Left Arm, Patient Position: Sitting, Cuff Size: Normal)   Pulse 81   Ht 5\' 3"  (1.6 m)   Wt 174 lb (78.9 kg)   BMI 30.82 kg/m   General:  Well developed, well nourished, no acute distress Skin:  Warm and dry Neck:  Midline trachea, normal thyroid, good ROM, no lymphadenopathy Lungs; Clear to auscultation bilaterally Breast:  No dominant palpable mass, retraction, or nipple discharge Cardiovascular: Regular rate and rhythm Abdomen:  Soft, non tender, no hepatosplenomegaly Pelvic:  External genitalia is normal in appearance, no lesions.  The vagina is normal in appearance. Urethra has no lesions or masses. The cervix is smooth.  Uterus is felt to be normal size, shape, and contour.  No adnexal masses or tenderness noted.Bladder is non tender, no masses felt. Rectal: Good sphincter tone, no polyps, + internal hemorrhoids felt.  Hemoccult  negative. Extremities/musculoskeletal:  No swelling or varicosities noted, no clubbing or cyanosis Psych:  No mood changes, alert and cooperative,seems happy AA is 5 Fall risk is low    07/15/2023    8:44 AM 05/15/2021   10:44 AM 09/20/2017   10:38 AM  Depression screen PHQ 2/9  Decreased Interest 0 1 0  Down, Depressed, Hopeless 0 0 0  PHQ - 2 Score 0 1 0  Altered sleeping 0 1   Tired, decreased energy 0 1   Change in appetite 0 0   Feeling bad or failure about yourself  0 0   Trouble concentrating 0 0   Moving slowly or fidgety/restless 0 0   Suicidal thoughts 0 0   PHQ-9 Score 0 3        07/15/2023    8:44 AM 05/15/2021   10:44 AM  GAD 7 : Generalized Anxiety Score  Nervous, Anxious, on Edge 0 1  Control/stop worrying 0 0  Worry too much - different things 0 1  Trouble relaxing 0 1  Restless 0 0  Easily annoyed or irritable 0 1  Afraid - awful might happen 0 1  Total GAD 7 Score 0 5      Upstream - 07/15/23 0844       Pregnancy Intention Screening   Does the patient want to become pregnant in the next year? No    Does the patient's partner want to become pregnant in the next year? No    Would the patient like to discuss contraceptive options today? No  Contraception Wrap Up   Current Method Female Sterilization    End Method Female Sterilization    Contraception Counseling Provided No             Examination chaperoned by Stephanie Apley RN  Impression and Plan: 1. Encounter for well woman exam with routine gynecological exam Pap and physical in 1 year Mammogram was negative 05/31/23 Colonoscopy per GI 2029  Will check labs  - CBC - Comprehensive metabolic panel - Lipid panel Stay active   2. Hemorrhoids, unspecified hemorrhoid type + internal hemorrhoids Will refill analpram-HC Meds ordered this encounter  Medications   hydrocortisone-pramoxine (ANALPRAM-HC) 2.5-1 % rectal cream    Sig: Place rectally 3 (three) times daily.    Dispense:  30 g     Refill:  3    Order Specific Question:   Supervising Provider    Answer:   Stephanie Paul, LUTHER H [[id number]]     3. Encounter for screening fecal occult blood testing Hemoccult was negative  - POCT occult blood stool  4. Screening cholesterol level - Lipid panel

## 2023-07-15 NOTE — Progress Notes (Signed)
Rx anusol supp

## 2024-05-25 ENCOUNTER — Other Ambulatory Visit (HOSPITAL_COMMUNITY): Payer: Self-pay | Admitting: Family Medicine

## 2024-05-25 DIAGNOSIS — Z1231 Encounter for screening mammogram for malignant neoplasm of breast: Secondary | ICD-10-CM

## 2024-06-02 ENCOUNTER — Encounter (HOSPITAL_COMMUNITY): Payer: Self-pay

## 2024-06-02 ENCOUNTER — Ambulatory Visit (HOSPITAL_COMMUNITY)
Admission: RE | Admit: 2024-06-02 | Discharge: 2024-06-02 | Disposition: A | Discharge status: Home / Self Care | Source: Ambulatory Visit | Attending: Family Medicine | Admitting: Family Medicine

## 2024-06-02 DIAGNOSIS — Z1231 Encounter for screening mammogram for malignant neoplasm of breast: Secondary | ICD-10-CM | POA: Diagnosis present

## 2024-08-29 ENCOUNTER — Ambulatory Visit: Admitting: Adult Health

## 2024-08-29 ENCOUNTER — Encounter: Payer: Self-pay | Admitting: Adult Health

## 2024-08-29 ENCOUNTER — Other Ambulatory Visit (HOSPITAL_COMMUNITY)
Admission: RE | Admit: 2024-08-29 | Discharge: 2024-08-29 | Disposition: A | Discharge status: Home / Self Care | Source: Ambulatory Visit | Attending: Adult Health | Admitting: Adult Health

## 2024-08-29 VITALS — BP 106/72 | HR 80 | Ht 63.0 in | Wt 176.0 lb

## 2024-08-29 DIAGNOSIS — Z01419 Encounter for gynecological examination (general) (routine) without abnormal findings: Secondary | ICD-10-CM | POA: Insufficient documentation

## 2024-08-29 DIAGNOSIS — Z1151 Encounter for screening for human papillomavirus (HPV): Secondary | ICD-10-CM

## 2024-08-29 DIAGNOSIS — K649 Unspecified hemorrhoids: Secondary | ICD-10-CM

## 2024-08-29 MED ORDER — HYDROCORT-PRAMOXINE (PERIANAL) 2.5-1 % EX CREA: 1.0000 | TOPICAL_CREAM | Freq: Three times a day (TID) | CUTANEOUS | 2 refills | Status: DC

## 2024-08-29 NOTE — Progress Notes (Signed)
 Patient ID: Stephanie Paul, female   DOB: 1976-02-18, 48 y.o.   MRN: [id number] History of Present Illness: Malik is a 48 year old white female,married, G1P1 in for a well woman gyn exam and pap. She has lost 22 lbs since June on Zepbound.  PCP is Dr Marvine   Current Medications, Allergies, Past Medical History, Past Surgical History, Family History and Social History were reviewed in [employer] electronic medical record.     Review of Systems: Patient denies any headaches, hearing loss, fatigue, blurred vision, shortness of breath, chest pain, abdominal pain, problems with bowel movements, urination, or intercourse. No joint pain or mood swings.  Has hemorrhoids that flare at times   Physical Exam:BP 106/72 (BP Location: Left Arm, Patient Position: Sitting, Cuff Size: Normal)   Pulse 80   Ht 5' 3 (1.6 m)   Wt 176 lb (79.8 kg)   BMI 31.18 kg/m   General:  Well developed, well nourished, no acute distress Skin:  Warm and dry Neck:  Midline trachea, normal thyroid , good ROM, no lymphadenopathy Lungs; Clear to auscultation bilaterally Breast:  No dominant palpable mass, retraction, or nipple discharge Cardiovascular: Regular rate and rhythm Abdomen:  Soft, non tender, no hepatosplenomegaly Pelvic:  External genitalia is normal in appearance, no lesions.  The vagina is normal in appearance. Urethra has no lesions or masses. The cervix is smooth, pap with HR HPV genotyping performed.  Uterus is felt to be normal size, shape, and contour.  No adnexal masses or tenderness noted.Bladder is non tender, no masses felt. Rectal:Deferred  Extremities/musculoskeletal:  No swelling or varicosities noted, no clubbing or cyanosis Psych:  No mood changes, alert and cooperative,seems happy AA is 4 Fall risk is low    08/29/2024   10:23 AM 07/15/2023    8:44 AM 05/15/2021   10:44 AM  Depression screen PHQ 2/9  Decreased Interest 0 0 1  Down, Depressed, Hopeless 0 0 0  PHQ - 2 Score 0 0 1   Altered sleeping 0 0 1  Tired, decreased energy 0 0 1  Change in appetite 0 0 0  Feeling bad or failure about yourself  0 0 0  Trouble concentrating 0 0 0  Moving slowly or fidgety/restless 0 0 0  Suicidal thoughts 0 0 0  PHQ-9 Score 0 0 3       08/29/2024   10:23 AM 07/15/2023    8:44 AM 05/15/2021   10:44 AM  GAD 7 : Generalized Anxiety Score  Nervous, Anxious, on Edge 0 0 1  Control/stop worrying 0 0 0  Worry too much - different things 0 0 1  Trouble relaxing 0 0 1  Restless 0 0 0  Easily annoyed or irritable 0 0 1  Afraid - awful might happen 0 0 1  Total GAD 7 Score 0 0 5      Upstream - 08/29/24 1019       Pregnancy Intention Screening   Does the patient want to become pregnant in the next year? No    Does the patient's partner want to become pregnant in the next year? No    Would the patient like to discuss contraceptive options today? No      Contraception Wrap Up   Current Method Female Sterilization    End Method Female Sterilization    Contraception Counseling Provided No          Examination chaperoned by Clarita Salt LPN  Impression and plan: 1. Encounter for gynecological  examination with Papanicolaou smear of cervix (Primary) Pap sent Pap in 3-5 years Physical in 1 year Labs with PCP Mammogram was negative 06/02/24 Colonoscopy per GI  - Cytology - PAP( [place])  2. Hemorrhoids, unspecified hemorrhoid type Will rx analpram  HC  Meds ordered this encounter  Medications   hydrocortisone -pramoxine (ANALPRAM -HC) 2.5-1 % rectal cream    Sig: Place 1 Application rectally 3 (three) times daily.    Dispense:  30 g    Refill:  2    Supervising Provider:   JAYNE MINDER H [[id number]]

## 2024-08-31 LAB — CYTOLOGY - PAP
Comment: NEGATIVE
Diagnosis: NEGATIVE
Diagnosis: REACTIVE
High risk HPV: NEGATIVE

## 2024-09-01 ENCOUNTER — Ambulatory Visit: Payer: Self-pay | Admitting: Adult Health

## 2024-09-27 ENCOUNTER — Encounter (INDEPENDENT_AMBULATORY_CARE_PROVIDER_SITE_OTHER): Payer: Self-pay | Admitting: Gastroenterology

## 2024-10-09 ENCOUNTER — Other Ambulatory Visit: Payer: Self-pay | Admitting: Adult Health
